# Patient Record
Sex: Male | Born: 1940 | Hispanic: Yes | Marital: Single | Smoking: Former smoker
Health system: Southern US, Community
[De-identification: ages and names within clinical notes are randomized; demographics above are authoritative.]

## PROBLEM LIST (undated history)

## (undated) DIAGNOSIS — I10 Essential (primary) hypertension: Secondary | ICD-10-CM

## (undated) DIAGNOSIS — D649 Anemia, unspecified: Secondary | ICD-10-CM

## (undated) DIAGNOSIS — M5137 Other intervertebral disc degeneration, lumbosacral region: Secondary | ICD-10-CM

## (undated) DIAGNOSIS — E785 Hyperlipidemia, unspecified: Secondary | ICD-10-CM

## (undated) DIAGNOSIS — F32A Depression, unspecified: Secondary | ICD-10-CM

## (undated) DIAGNOSIS — B029 Zoster without complications: Secondary | ICD-10-CM

## (undated) DIAGNOSIS — R0602 Shortness of breath: Secondary | ICD-10-CM

## (undated) DIAGNOSIS — K279 Peptic ulcer, site unspecified, unspecified as acute or chronic, without hemorrhage or perforation: Secondary | ICD-10-CM

## (undated) DIAGNOSIS — J45909 Unspecified asthma, uncomplicated: Secondary | ICD-10-CM

## (undated) DIAGNOSIS — F329 Major depressive disorder, single episode, unspecified: Secondary | ICD-10-CM

## (undated) DIAGNOSIS — K219 Gastro-esophageal reflux disease without esophagitis: Secondary | ICD-10-CM

## (undated) DIAGNOSIS — F1010 Alcohol abuse, uncomplicated: Secondary | ICD-10-CM

## (undated) DIAGNOSIS — Z8669 Personal history of other diseases of the nervous system and sense organs: Secondary | ICD-10-CM

## (undated) DIAGNOSIS — C3490 Malignant neoplasm of unspecified part of unspecified bronchus or lung: Secondary | ICD-10-CM

## (undated) DIAGNOSIS — M5030 Other cervical disc degeneration, unspecified cervical region: Secondary | ICD-10-CM

## (undated) DIAGNOSIS — I639 Cerebral infarction, unspecified: Secondary | ICD-10-CM

## (undated) DIAGNOSIS — M51379 Other intervertebral disc degeneration, lumbosacral region without mention of lumbar back pain or lower extremity pain: Secondary | ICD-10-CM

## (undated) HISTORY — DX: Other cervical disc degeneration, unspecified cervical region: M50.30

## (undated) HISTORY — DX: Unspecified asthma, uncomplicated: J45.909

## (undated) HISTORY — DX: Alcohol abuse, uncomplicated: F10.10

## (undated) HISTORY — DX: Anemia, unspecified: D64.9

## (undated) HISTORY — DX: Essential (primary) hypertension: I10

## (undated) HISTORY — DX: Major depressive disorder, single episode, unspecified: F32.9

## (undated) HISTORY — DX: Zoster without complications: B02.9

## (undated) HISTORY — DX: Shortness of breath: R06.02

## (undated) HISTORY — DX: Gastro-esophageal reflux disease without esophagitis: K21.9

## (undated) HISTORY — DX: Depression, unspecified: F32.A

## (undated) HISTORY — PX: LYMPH NODE BIOPSY: SHX201

## (undated) HISTORY — DX: Cerebral infarction, unspecified: I63.9

## (undated) HISTORY — DX: Other intervertebral disc degeneration, lumbosacral region without mention of lumbar back pain or lower extremity pain: M51.379

## (undated) HISTORY — DX: Hyperlipidemia, unspecified: E78.5

## (undated) HISTORY — PX: LUNG BIOPSY: SHX5088

## (undated) HISTORY — DX: Other intervertebral disc degeneration, lumbosacral region: M51.37

## (undated) HISTORY — DX: Malignant neoplasm of unspecified part of unspecified bronchus or lung: C34.90

## (undated) HISTORY — DX: Peptic ulcer, site unspecified, unspecified as acute or chronic, without hemorrhage or perforation: K27.9

## (undated) HISTORY — DX: Personal history of other diseases of the nervous system and sense organs: Z86.69

---

## 2014-02-14 ENCOUNTER — Other Ambulatory Visit (INDEPENDENT_AMBULATORY_CARE_PROVIDER_SITE_OTHER): Payer: Medicare Other

## 2014-02-14 ENCOUNTER — Ambulatory Visit (INDEPENDENT_AMBULATORY_CARE_PROVIDER_SITE_OTHER): Payer: Medicare Other | Admitting: Physician Assistant

## 2014-02-14 ENCOUNTER — Encounter: Payer: Self-pay | Admitting: Physician Assistant

## 2014-02-14 VITALS — BP 172/98 | HR 83 | Temp 98.5°F | Ht 71.0 in | Wt 147.8 lb

## 2014-02-14 DIAGNOSIS — I10 Essential (primary) hypertension: Secondary | ICD-10-CM

## 2014-02-14 DIAGNOSIS — D1779 Benign lipomatous neoplasm of other sites: Secondary | ICD-10-CM

## 2014-02-14 DIAGNOSIS — R351 Nocturia: Secondary | ICD-10-CM

## 2014-02-14 DIAGNOSIS — R9431 Abnormal electrocardiogram [ECG] [EKG]: Secondary | ICD-10-CM

## 2014-02-14 DIAGNOSIS — H538 Other visual disturbances: Secondary | ICD-10-CM

## 2014-02-14 DIAGNOSIS — Z23 Encounter for immunization: Secondary | ICD-10-CM

## 2014-02-14 DIAGNOSIS — Z1211 Encounter for screening for malignant neoplasm of colon: Secondary | ICD-10-CM

## 2014-02-14 DIAGNOSIS — D171 Benign lipomatous neoplasm of skin and subcutaneous tissue of trunk: Secondary | ICD-10-CM

## 2014-02-14 DIAGNOSIS — F4310 Post-traumatic stress disorder, unspecified: Secondary | ICD-10-CM

## 2014-02-14 DIAGNOSIS — Z0000 Encounter for general adult medical examination without abnormal findings: Secondary | ICD-10-CM

## 2014-02-14 LAB — LIPID PANEL
Cholesterol: 194 mg/dL (ref 0–200)
HDL: 67.1 mg/dL (ref >39.00)
LDL Cholesterol: 118 mg/dL — ABNORMAL HIGH (ref 0–99)
Total CHOL/HDL Ratio: 3
Triglycerides: 43 mg/dL (ref 0.0–149.0)
VLDL: 8.6 mg/dL (ref 0.0–40.0)

## 2014-02-14 LAB — URINALYSIS, ROUTINE W REFLEX MICROSCOPIC
Hgb urine dipstick: NEGATIVE
Ketones, ur: NEGATIVE
LEUKOCYTES UA: NEGATIVE
Nitrite: NEGATIVE
SPECIFIC GRAVITY, URINE: 1.025 (ref 1.000–1.030)
UROBILINOGEN UA: 1 (ref 0.0–1.0)
Urine Glucose: NEGATIVE
pH: 6 (ref 5.0–8.0)

## 2014-02-14 LAB — CBC WITH DIFFERENTIAL/PLATELET
Basophils Absolute: 0.1 10*3/uL (ref 0.0–0.1)
Basophils Relative: 0.7 % (ref 0.0–3.0)
EOS ABS: 0.1 10*3/uL (ref 0.0–0.7)
Eosinophils Relative: 1.1 % (ref 0.0–5.0)
HCT: 39.3 % (ref 39.0–52.0)
Hemoglobin: 12.4 g/dL — ABNORMAL LOW (ref 13.0–17.0)
Lymphocytes Relative: 24.4 % (ref 12.0–46.0)
Lymphs Abs: 1.8 10*3/uL (ref 0.7–4.0)
MCHC: 31.7 g/dL (ref 30.0–36.0)
MCV: 92.3 fl (ref 78.0–100.0)
Monocytes Absolute: 0.8 10*3/uL (ref 0.1–1.0)
Monocytes Relative: 10.8 % (ref 3.0–12.0)
NEUTROS PCT: 63 % (ref 43.0–77.0)
Neutro Abs: 4.8 10*3/uL (ref 1.4–7.7)
Platelets: 199 10*3/uL (ref 150.0–400.0)
RBC: 4.25 Mil/uL (ref 4.22–5.81)
RDW: 14 % (ref 11.5–14.6)
WBC: 7.6 10*3/uL (ref 4.5–10.5)

## 2014-02-14 LAB — BASIC METABOLIC PANEL
BUN: 19 mg/dL (ref 6–23)
CO2: 30 mEq/L (ref 19–32)
CREATININE: 0.9 mg/dL (ref 0.4–1.5)
Calcium: 9.4 mg/dL (ref 8.4–10.5)
Chloride: 106 mEq/L (ref 96–112)
GFR: 91.61 mL/min (ref >60.00)
GLUCOSE: 82 mg/dL (ref 70–99)
Potassium: 4.1 mEq/L (ref 3.5–5.1)
Sodium: 140 mEq/L (ref 135–145)

## 2014-02-14 LAB — HEPATIC FUNCTION PANEL
ALT: 10 U/L (ref 0–53)
AST: 15 U/L (ref 0–37)
Albumin: 3.7 g/dL (ref 3.5–5.2)
Alkaline Phosphatase: 77 U/L (ref 39–117)
BILIRUBIN DIRECT: 0.1 mg/dL (ref 0.0–0.3)
BILIRUBIN TOTAL: 1.1 mg/dL (ref 0.3–1.2)
Total Protein: 7.2 g/dL (ref 6.0–8.3)

## 2014-02-14 LAB — PSA: PSA: 0.72 ng/mL (ref 0.10–4.00)

## 2014-02-14 LAB — TSH: TSH: 0.93 u[IU]/mL (ref 0.35–5.50)

## 2014-02-14 LAB — CARDIAC PANEL
CK-MB: 1.2 ng/mL (ref 0.3–4.0)
Relative Index: 1.8 calc (ref 0.0–2.5)
Total CK: 68 U/L (ref 7–232)

## 2014-02-14 MED ORDER — HYDROCHLOROTHIAZIDE 12.5 MG PO CAPS: 12.5000 mg | ORAL_CAPSULE | Freq: Every day | ORAL | Status: DC

## 2014-02-14 MED ORDER — OLMESARTAN MEDOXOMIL 20 MG PO TABS: 20.0000 mg | ORAL_TABLET | Freq: Every day | ORAL | Status: DC

## 2014-02-14 NOTE — Patient Instructions (Addendum)
It was great meeting you today [name]!   Labs have been ordered for you, when you report to lab please be fasting.      Lipoma A lipoma is a noncancerous (benign) tumor composed of fat cells. They are usually found under the skin (subcutaneous). A lipoma may occur in any tissue of the body that contains fat. Common areas for lipomas to appear include the back, shoulders, buttocks, and thighs. Lipomas are a very common soft tissue growth. They are soft and grow slowly. Most problems caused by a lipoma depend on where it is growing. DIAGNOSIS  A lipoma can be diagnosed with a physical exam. These tumors rarely become cancerous, but radiographic studies can help determine this for certain. Studies used may include:  Computerized X-ray scans (CT or CAT scan).  Computerized magnetic scans (MRI). TREATMENT  Small lipomas that are not causing problems may be watched. If a lipoma continues to enlarge or causes problems, removal is often the best treatment. Lipomas can also be removed to improve appearance. Surgery is done to remove the fatty cells and the surrounding capsule. Most often, this is done with medicine that numbs the area (local anesthetic). The removed tissue is examined under a microscope to make sure it is not cancerous. Keep all follow-up appointments with your caregiver. SEEK MEDICAL CARE IF:   The lipoma becomes larger or hard.  The lipoma becomes painful, red, or increasingly swollen. These could be signs of infection or a more serious condition. Document Released: 11/22/2002 Document Revised: 02/24/2012 Document Reviewed: 05/04/2010 [facility] Patient Information 2014 [place], [place].  DASH Diet The DASH diet stands for "Dietary Approaches to Stop Hypertension." It is a healthy eating plan that has been shown to reduce high blood pressure (hypertension) in as little as 14 days, while also possibly providing other significant health benefits. These other health benefits  include reducing the risk of breast cancer after menopause and reducing the risk of type 2 diabetes, heart disease, colon cancer, and stroke. Health benefits also include weight loss and slowing kidney failure in patients with chronic kidney disease.  DIET GUIDELINES  Limit salt (sodium). Your diet should contain less than 1500 mg of sodium daily.  Limit refined or processed carbohydrates. Your diet should include mostly whole grains. Desserts and added sugars should be used sparingly.  Include small amounts of heart-healthy fats. These types of fats include nuts, oils, and tub margarine. Limit saturated and trans fats. These fats have been shown to be harmful in the body. CHOOSING FOODS  The following food groups are based on a 2000 calorie diet. See your Registered Dietitian for individual calorie needs. Grains and Grain Products (6 to 8 servings daily)  Eat More Often: Whole-wheat bread, brown rice, whole-grain or wheat pasta, quinoa, popcorn without added fat or salt (air popped).  Eat Less Often: White bread, white pasta, white rice, cornbread. Vegetables (4 to 5 servings daily)  Eat More Often: Fresh, frozen, and canned vegetables. Vegetables may be raw, steamed, roasted, or grilled with a minimal amount of fat.  Eat Less Often/Avoid: Creamed or fried vegetables. Vegetables in a cheese sauce. Fruit (4 to 5 servings daily)  Eat More Often: All fresh, canned (in natural juice), or frozen fruits. Dried fruits without added sugar. One hundred percent fruit juice ( cup [237 mL] daily).  Eat Less Often: Dried fruits with added sugar. Canned fruit in light or heavy syrup. [employer], Fish, and Poultry (2 servings or less daily. One serving is 3  to 4 oz [85-114 g]).  Eat More Often: Ninety percent or leaner ground beef, tenderloin, sirloin. Round cuts of beef, chicken breast, [place] breast. All fish. Grill, bake, or broil your meat. Nothing should be fried.  Eat Less Often/Avoid: Fatty  cuts of meat, [place], or chicken leg, thigh, or wing. Fried cuts of meat or fish. Dairy (2 to 3 servings)  Eat More Often: Low-fat or fat-free milk, low-fat plain or light yogurt, reduced-fat or part-skim cheese.  Eat Less Often/Avoid: Milk (whole, 2%).Whole milk yogurt. Full-fat cheeses. Nuts, Seeds, and Legumes (4 to 5 servings per week)  Eat More Often: All without added salt.  Eat Less Often/Avoid: Salted nuts and seeds, canned beans with added salt. Fats and Sweets (limited)  Eat More Often: Vegetable oils, tub margarines without trans fats, sugar-free gelatin. Mayonnaise and salad dressings.  Eat Less Often/Avoid: Coconut oils, palm oils, butter, stick margarine, cream, half and half, cookies, candy, pie. FOR MORE INFORMATION The Dash Diet Eating Plan: www.dashdiet.org Document Released: 11/21/2011 Document Revised: 02/24/2012 Document Reviewed: 11/21/2011 [facility] Patient Information 2014 [place], [place]. Hypertension Hypertension is another name for high blood pressure. High blood pressure may mean that your heart needs to work harder to pump blood. Blood pressure consists of two numbers, which includes a higher number over a lower number (example: 110/72). HOME CARE   Make lifestyle changes as told by your doctor. This may include weight loss and exercise.  Take your blood pressure medicine every day.  Limit how much salt you use.  Stop smoking if you smoke.  Do not use drugs.  Talk to your doctor if you are using decongestants or birth control pills. These medicines might make blood pressure higher.  Females should not drink more than 1 alcoholic drink per day. Males should not drink more than 2 alcoholic drinks per day.  See your doctor as told. GET HELP RIGHT AWAY IF:   You have a blood pressure reading with a top number of 180 or higher.  You get a very bad headache.  You get blurred or changing vision.  You feel confused.  You feel weak, numb, or  faint.  You get chest or belly (abdominal) pain.  You throw up (vomit).  You cannot breathe very well. MAKE SURE YOU:   Understand these instructions.  Will watch your condition.  Will get help right away if you are not doing well or get worse. Document Released: 05/20/2008 Document Revised: 02/24/2012 Document Reviewed: 05/20/2008 [facility] Patient Information 2014 [place], [place].   Health Maintenance, Males A healthy lifestyle and preventative care can promote health and wellness.  Maintain regular health, dental, and eye exams.  Eat a healthy diet. Foods like vegetables, fruits, whole grains, low-fat dairy products, and lean protein foods contain the nutrients you need and are low in calories. Decrease your intake of foods high in solid fats, added sugars, and salt. Get information about a proper diet from your health care provider, if necessary.  Regular physical exercise is one of the most important things you can do for your health. Most adults should get at least 150 minutes of moderate-intensity exercise (any activity that increases your heart rate and causes you to sweat) each week. In addition, most adults need muscle-strengthening exercises on 2 or more days a week.   Maintain a healthy weight. The body mass index (BMI) is a screening tool to identify possible weight problems. It provides an estimate of body fat based on height and weight. Your health care  provider can find your BMI and can help you achieve or maintain a healthy weight. For males 20 years and older:  A BMI below 18.5 is considered underweight.  A BMI of 18.5 to 24.9 is normal.  A BMI of 25 to 29.9 is considered overweight.  A BMI of 30 and above is considered obese.  Maintain normal blood lipids and cholesterol by exercising and minimizing your intake of saturated fat. Eat a balanced diet with plenty of fruits and vegetables. Blood tests for lipids and cholesterol should begin at age 68 and be  repeated every 5 years. If your lipid or cholesterol levels are high, you are over 50, or you are at high risk for heart disease, you may need your cholesterol levels checked more frequently.Ongoing high lipid and cholesterol levels should be treated with medicines, if diet and exercise are not working.  If you smoke, find out from your health care provider how to quit. If you do not use tobacco, do not start.  Lung cancer screening is recommended for adults aged 73 80 years who are at high risk for developing lung cancer because of a history of smoking. A yearly low-dose CT scan of the lungs is recommended for people who have at least a 30-pack-year history of smoking and are a current smoker or have quit within the past 15 years. A pack year of smoking is smoking an average of 1 pack of cigarettes a day for 1 year (for example, a 30-pack-year history of smoking could mean smoking 1 pack a day for 30 years or 2 packs a day for 15 years). Yearly screening should continue until the smoker has stopped smoking for at least 15 years. Yearly screening should be stopped for people who develop a health problem that would prevent them from having lung cancer treatment.  If you choose to drink alcohol, do not have more than 2 drinks per day. One drink is considered to be 12 oz (360 mL) of beer, 5 oz (150 mL) of wine, or 1.5 oz (45 mL) of liquor.  Avoid use of street drugs. Do not share needles with anyone. Ask for help if you need support or instructions about stopping the use of drugs.  High blood pressure causes heart disease and increases the risk of stroke. Blood pressure should be checked at least every 1 2 years. Ongoing high blood pressure should be treated with medicines if weight loss and exercise are not effective.  If you are 72 73 years old, ask your health care provider if you should take aspirin to prevent heart disease.  Diabetes screening involves taking a blood sample to check your fasting  blood sugar level. This should be done once every 3 years after age 39, if you are at a normal weight and without risk factors for diabetes. Testing should be considered at a younger age or be carried out more frequently if you are overweight and have at least 1 risk factor for diabetes.  Colorectal cancer can be detected and often prevented. Most routine colorectal cancer screening begins at the age of 39 and continues through age 5. However, your health care provider may recommend screening at an earlier age if you have risk factors for colon cancer. On a yearly basis, your health care provider may provide home test kits to check for hidden blood in the stool. A small camera at the end of a tube may be used to directly examine the colon (sigmoidoscopy or colonoscopy) to detect the  earliest forms of colorectal cancer. Talk to your health care provider about this at age 78, when routine screening begins. A direct exam of the colon should be repeated every 5 10 years through age 100, unless early forms of pre-cancerous polyps or small growths are found.  People who are at an increased risk for hepatitis B should be screened for this virus. You are considered at high risk for hepatitis B if:  You were born in a country where hepatitis B occurs often. Talk with your health care provider about which countries are considered high-risk.  Your parents were born in a high-risk country and you have not received a shot to protect against hepatitis B (hepatitis B vaccine).  You have HIV or AIDS.  You use needles to inject street drugs.  You live with, or have sex with, someone who has hepatitis B.  You are a man who has sex with other men (MSM).  You get hemodialysis treatment.  You take certain medicines for conditions like cancer, organ transplantation, and autoimmune conditions.  Hepatitis C blood testing is recommended for all people born from [birth year] through 1965 and any individual with known risk  factors for hepatitis C.  Healthy men should no longer receive prostate-specific antigen (PSA) blood tests as part of routine cancer screening. Talk to your health care provider about prostate cancer screening.  Testicular cancer screening is not recommended for adolescents or adult males who have no symptoms. Screening includes self-exam, a health care provider exam, and other screening tests. Consult with your health care provider about any symptoms you have or any concerns you have about testicular cancer.  Practice safe sex. Use condoms and avoid high-risk sexual practices to reduce the spread of sexually transmitted infections (STIs).  Use sunscreen. Apply sunscreen liberally and repeatedly throughout the day. You should seek shade when your shadow is shorter than you. Protect yourself by wearing long sleeves, pants, a wide-brimmed hat, and sunglasses year round, whenever you are outdoors.  Tell your health care provider of new moles or changes in moles, especially if there is a change in shape or color. Also tell your provider if a mole is larger than the size of a pencil eraser.  A one-time screening for abdominal aortic aneurysm (AAA) and surgical repair of large AAAs by ultrasound is recommended for men aged 29 75 years who are current or former smokers.  Stay current with your vaccines (immunizations). Document Released: 05/30/2008 Document Revised: 09/22/2013 Document Reviewed: 04/29/2011 [facility] Patient Information 2014 [place], [place].

## 2014-02-14 NOTE — Progress Notes (Signed)
Subjective:    Patient ID: Jared Rogers, male    DOB: 05-06-1941, 73 y.o.   MRN: [id number]  HPI Comments: Patient is a 73 year old male who presents to the office to establish care. Patient does not speak english and his brother is with him acting as interpreter. Last medical care is unknown.  Patient recently moved to the area from [place], resides with his brother and sister in [place]. Reports history of high blood pressure readings at home, numbers uncertain. Reports three days PTA experienced mild chest pain with palpitations, pains lasted nearly three hours. During this time states he was sweating with some SOB. Has not had anything since. Family members report he has a "tumor" on his left back shoulder that has been present for 10 years, is not painful or bothersome. Is up 3-4 times nightly to urinate. Complains of blurring vision. Family members report they think the patient suffers from PTSD, stating patient has terrible nightmares at night regarding his younger years growing up during war time. Patient dreams of people coming into house and raiding the house, attempting to poison people and the house being bombed.  Reports no other medical history or medication use. Denies, N/V/F/C, change in bowel habits, blood in urine or stool, difficulty initiating urination, pain with urination, inability to empty bladder. Denies lightheaded, dizziness, weakness, eye pain, HA,    Review of Systems  Constitutional: Positive for diaphoresis. Negative for fever and chills.  HENT: Negative for ear pain and trouble swallowing.   Eyes: Positive for visual disturbance (blurry). Negative for photophobia and pain.  Respiratory: Positive for shortness of breath. Negative for cough and wheezing.   Cardiovascular: Positive for chest pain (as stated in HPI) and palpitations. Negative for leg swelling.  Gastrointestinal: Negative for nausea, vomiting, abdominal pain and blood in stool.  Genitourinary: Negative for  dysuria, frequency, hematuria, difficulty urinating and testicular pain.  Musculoskeletal: Negative for back pain, gait problem and neck pain.  Skin: Negative for color change and rash.       Mass on left upper back  Neurological: Negative for dizziness, weakness, light-headedness, numbness and headaches.  Psychiatric/Behavioral: Positive for sleep disturbance.       Past Medical History  Diagnosis Date  . Depression   . Hypertension   . Hx of migraines   . Alcohol abuse   . GERD (gastroesophageal reflux disease)    Family History  Problem Relation Age of Onset  . Heart disease Father   . Lung cancer Sister    History   Social History  . Marital Status: Widowed    Spouse Name: N/A    Number of Children: N/A  . Years of Education: N/A   Social History Main Topics  . Smoking status: Current Every Day Smoker  . Smokeless tobacco: None  . Alcohol Use: No  . Drug Use: No  . Sexual Activity: None   Other Topics Concern  . None   Social History Narrative  . None   History reviewed. No pertinent past surgical history. No Known Allergies  No current outpatient prescriptions on file prior to visit.   No current facility-administered medications on file prior to visit.    Objective:   Physical Exam  Vitals reviewed. Constitutional: He is oriented to person, place, and time. He appears well-developed and well-nourished. No distress.  HENT:  Head: Normocephalic and atraumatic.  Right Ear: Hearing, tympanic membrane, external ear and ear canal normal.  Left Ear: Hearing, tympanic membrane, external ear  and ear canal normal.  Nose: Nose normal.  Mouth/Throat: Uvula is midline and oropharynx is clear and moist.  Poor dentition, few teeth remain  Eyes: Conjunctivae are normal. Pupils are equal, round, and reactive to light.  Clouding of bilateral cornea  Neck: Trachea normal and normal range of motion. No tracheal tenderness present. Carotid bruit is not present.    Cardiovascular: Normal rate, regular rhythm and intact distal pulses.  Exam reveals no gallop and no friction rub.   No murmur heard. Pulses:      Radial pulses are 2+ on the right side, and 2+ on the left side.       Dorsalis pedis pulses are 2+ on the right side, and 2+ on the left side.  Pulmonary/Chest: He has no wheezes. He has no rhonchi. He has no rales.  Abdominal: Soft. Normal appearance and bowel sounds are normal. He exhibits no distension. There is no hepatosplenomegaly. There is no tenderness. There is no CVA tenderness.  Musculoskeletal:  FROM U/LE bilateral  Lymphadenopathy:    He has no cervical adenopathy.       Right: No supraclavicular adenopathy present.       Left: No supraclavicular adenopathy present.  Neurological: He is alert and oriented to person, place, and time. He has normal strength. No cranial nerve deficit or sensory deficit. He displays a negative Romberg sign. Coordination and gait normal. GCS eye subscore is 4. GCS verbal subscore is 5. GCS motor subscore is 6.  Reflex Scores:      Patellar reflexes are 2+ on the right side and 2+ on the left side. Skin: Skin is warm and dry. He is not diaphoretic.  10-12 cm fluctuant, non tender mass noted to left upper back over scapula. Overlying soft tissue without erythema, tactile heat or drainage. Appears consistent with a lipoma.  Psychiatric: He has a normal mood and affect. His speech is normal and behavior is normal.      Filed Vitals:   02/14/14 0928  BP: 172/98  Pulse: 83  Temp: 98.5 F (36.9 C)   Patient able to perform ADL's, has had no falls from a standing position.  ECG: today Sinus rhythm, left axis fascicular block, non specific T abnormality, rate: 78 bpm.  Assessment & Plan:     CPX/v70.0 - Patient has been counseled on age-appropriate routine health concerns for screening and prevention. These are reviewed and up-to-date. Immunizations are up-to-date or declined. Labs ordered and ECG  reviewed.  HM: Refer for colonoscopy Updated Tetanus and pneumovax  HTN: ECG today-reading above Rx for HCTZ 12.5 mg once daily and Olmesartan 20 mg once daily RTO in 1 month for evaluation. Patient instructed to take bp readings at home and bring record to next appointment.  Blurry vision: Referral to Ophthalmology for evaluation  Lipoma: Referral to general surgeon for evaluation and treatment  Abnormal ECG Order for echo

## 2014-02-14 NOTE — Progress Notes (Signed)
Pre-visit discussion using our clinic review tool. No additional management support is needed unless otherwise documented below in the visit note.  

## 2014-02-15 ENCOUNTER — Telehealth: Payer: Self-pay | Admitting: *Deleted

## 2014-02-15 LAB — TROPONIN I: Troponin I: <0.01 ng/mL (ref <0.06)

## 2014-02-15 NOTE — Telephone Encounter (Signed)
Amy phoned & stated patient's bp was 201/96.  Stated she did not p/u benicar b/c of expense (a month's worth of samples) which she is going to p/u.  Also, is requesting possible alternate off of [employer]'s $4 formulary list (dropping off when she p/u samples).    Please advise.

## 2014-02-16 ENCOUNTER — Telehealth: Payer: Self-pay | Admitting: *Deleted

## 2014-02-16 NOTE — Telephone Encounter (Signed)
Miss and close lab encounter by mistake. Called pt spoke with his sister-in-law since pt can't speak english. Bonnita Levan response concerning his labs. See msg below.../lmb     Result Notes    Notes Recorded by Stacy Gardner, PA-C on 02/15/2014 at 11:20 PM Please phone patient and inform him all labs were normal. Keep f/u appointment as scheduled.            Results   Urinalysis, Routine w reflex microscopic (Order [id number])        Urinalysis, Routine w reflex microscopic  Status: Final result     Visible to patient: This result is not viewable by the patient.     Next appt: 03/02/2014 at 01:00 PM in No Specialty (MC-SITE 3 ECHO ECHO 3)     Dx: Unspecified essential hypertension; N...               Notes Recorded by Stacy Gardner, PA-C on 02/15/2014 at 11:20 PM Please phone patient and inform him all labs were normal. Keep f/u appointment as scheduled.      Ref Range 2d ago   Color, Urine Yellow;Lt. Yellow  YELLOW     APPearance Clear  CLEAR     Specific Gravity, Urine 1.000-1.030  1.025     pH 5.0 - 8.0  6.0     Total Protein, Urine Negative  TRACE (A)     Urine Glucose Negative  NEGATIVE     Ketones, ur Negative  NEGATIVE     Bilirubin Urine Negative  SMALL (A)     Hgb urine dipstick Negative  NEGATIVE     Urobilinogen, UA 0.0 - 1.0  1.0     Leukocytes, UA Negative  NEGATIVE     Nitrite Negative  NEGATIVE     WBC, UA 0-2/hpf  0-2/hpf     RBC / HPF 0-2/hpf  0-2/hpf     Mucus, UA None  Presence of (A)     Squamous Epithelial / LPF Rare(0-4/hpf)  Rare(0-4/hpf)     Hyaline Casts, UA None  Presence of (A)     Resulting Agency [facility]    Specimen Collected: 02/14/14 10:49 AM Last Resulted: 02/14/14 11:33 AM                    Other Results from 02/14/2014             CBC with Differential  Status: Final result     Visible to patient: This result is not viewable by the patient.     Next appt: 03/02/2014 at 01:00 PM in No Specialty (MC-SITE 3 ECHO ECHO 3)      Dx: Unspecified essential hypertension; N...               Notes Recorded by Stacy Gardner, PA-C on 02/15/2014 at 11:20 PM Please phone patient and inform him all labs were normal. Keep f/u appointment as scheduled.      Ref Range 2d ago   WBC 4.5 - 10.5 K/uL 7.6     RBC 4.22 - 5.81 Mil/uL 4.25     Hemoglobin 13.0 - 17.0 g/dL 12.4 (L)     HCT 39.0 - 52.0 % 39.3     MCV 78.0 - 100.0 fl 92.3     MCHC 30.0 - 36.0 g/dL 31.7     RDW 11.5 - 14.6 % 14.0     Platelets 150.0 - 400.0 K/uL 199.0     Neutrophils Relative % 43.0 - 77.0 %  63.0     Lymphocytes Relative 12.0 - 46.0 % 24.4     Monocytes Relative 3.0 - 12.0 % 10.8     Eosinophils Relative 0.0 - 5.0 % 1.1     Basophils Relative 0.0 - 3.0 % 0.7     Neutro Abs 1.4 - 7.7 K/uL 4.8     Lymphs Abs 0.7 - 4.0 K/uL 1.8     Monocytes Absolute 0.1 - 1.0 K/uL 0.8     Eosinophils Absolute 0.0 - 0.7 K/uL 0.1     Basophils Absolute 0.0 - 0.1 K/uL 0.1     Resulting Agency [facility]    Specimen Collected: 02/14/14 10:49 AM Last Resulted: 02/14/14 11:21 AM                 Lipid panel  Status: Final result     Visible to patient: This result is not viewable by the patient.     Next appt: 03/02/2014 at 01:00 PM in No Specialty (MC-SITE 3 ECHO ECHO 3)     Dx: Unspecified essential hypertension; N...               Notes Recorded by Stacy Gardner, PA-C on 02/15/2014 at 11:20 PM Please phone patient and inform him all labs were normal. Keep f/u appointment as scheduled.      Ref Range 2d ago   Cholesterol 0 - 200 mg/dL 194     Comments: ATP III Classification       Desirable:  < 200 mg/dL               Borderline High:  200 - 239 mg/dL          High:  > = 240 mg/dL  Triglycerides 0.0 - 149.0 mg/dL 43.0     Comments: Normal:  <150 mg/dLBorderline High:  150 - 199 mg/dL  HDL >39.00 mg/dL 67.10     VLDL 0.0 - 40.0 mg/dL 8.6     LDL Cholesterol 0 - 99 mg/dL 118 (H)     Total CHOL/HDL Ratio   3     Comments:                 Men          Women1/2 Average Risk     3.4          3.3Average Risk          5.0          4.42X Average Risk          9.6          7.13X Average Risk          15.0          11.0                      Resulting Agency [facility]    Specimen Collected: 02/14/14 10:49 AM Last Resulted: 02/14/14  3:09 PM                 TSH  Status: Final result     Visible to patient: This result is not viewable by the patient.     Next appt: 03/02/2014 at 01:00 PM in No Specialty (MC-SITE 3 ECHO ECHO 3)     Dx: Unspecified essential hypertension; N...            Notes Recorded by Stacy Gardner, PA-C on 02/15/2014 at 11:20 PM Please phone patient and inform him  all labs were normal. Keep f/u appointment as scheduled.      Ref Range 2d ago   TSH 0.35 - 5.50 uIU/mL 0.93     Resulting Agency [facility]    Specimen Collected: 02/14/14 10:49 AM Last Resulted: 02/14/14  3:09 PM                 Hepatic function panel  Status: Final result     Visible to patient: This result is not viewable by the patient.     Next appt: 03/02/2014 at 01:00 PM in No Specialty (MC-SITE 3 ECHO ECHO 3)     Dx: Unspecified essential hypertension; N...            Notes Recorded by Stacy Gardner, PA-C on 02/15/2014 at 11:20 PM Please phone patient and inform him all labs were normal. Keep f/u appointment as scheduled.      Ref Range 2d ago   Total Bilirubin 0.3 - 1.2 mg/dL 1.1     Bilirubin, Direct 0.0 - 0.3 mg/dL 0.1     Alkaline Phosphatase 39 - 117 U/L 77     AST 0 - 37 U/L 15     ALT 0 - 53 U/L 10     Total Protein 6.0 - 8.3 g/dL 7.2     Albumin 3.5 - 5.2 g/dL 3.7     Resulting Agency [facility]    Specimen Collected: 02/14/14 10:49 AM Last Resulted: 02/14/14  3:09 PM                 Basic metabolic panel  Status: Final result     Visible to patient: This result is not viewable by the patient.     Next appt: 03/02/2014 at 01:00 PM in No Specialty (MC-SITE 3 ECHO ECHO 3)     Dx: Unspecified essential  hypertension; N...            Notes Recorded by Stacy Gardner, PA-C on 02/15/2014 at 11:20 PM Please phone patient and inform him all labs were normal. Keep f/u appointment as scheduled.      Ref Range 2d ago   Sodium 135 - 145 mEq/L 140     Potassium 3.5 - 5.1 mEq/L 4.1     Chloride 96 - 112 mEq/L 106     CO2 19 - 32 mEq/L 30     Glucose, Bld 70 - 99 mg/dL 82     BUN 6 - 23 mg/dL 19     Creatinine, Ser 0.4 - 1.5 mg/dL 0.9     Calcium 8.4 - 10.5 mg/dL 9.4     GFR >60.00 mL/min 91.61     Resulting Agency [facility]    Specimen Collected: 02/14/14 10:49 AM Last Resulted: 02/14/14  3:09 PM                 PSA  Status: Final result     Visible to patient: This result is not viewable by the patient.     Next appt: 03/02/2014 at 01:00 PM in No Specialty (MC-SITE 3 ECHO ECHO 3)     Dx: Unspecified essential hypertension; N...            Notes Recorded by Stacy Gardner, PA-C on 02/15/2014 at 11:20 PM Please phone patient and inform him all labs were normal. Keep f/u appointment as scheduled.      Ref Range 2d ago   PSA 0.10 - 4.00 ng/mL 0.72     Resulting Agency  [facility]  Specimen Collected: 02/14/14 10:49 AM Last Resulted: 02/14/14  3:09 PM                 Troponin I  Status: Final result     Visible to patient: This result is not viewable by the patient.     Next appt: 03/02/2014 at 01:00 PM in No Specialty (MC-SITE 3 ECHO ECHO 3)     Dx: Unspecified essential hypertension; N...            Notes Recorded by Stacy Gardner, PA-C on 02/15/2014 at 11:20 PM Please phone patient and inform him all labs were normal. Keep f/u appointment as scheduled.      Ref Range 2d ago   Troponin I <0.06 ng/mL <0.01     Comments: No indication of myocardial injury.  Resulting Agency [facility]        Result Narrative        Performed at:  [address], [address]                [place], [place] [address]         Specimen Collected:  02/14/14 10:49 AM Last Resulted: 02/15/14 12:11 PM                 Cardiac panel  Status: Final result     Visible to patient: This result is not viewable by the patient.     Next appt: 03/02/2014 at 01:00 PM in No Specialty (MC-SITE 3 ECHO ECHO 3)     Dx: Unspecified essential hypertension; N...            Notes Recorded by Stacy Gardner, PA-C on 02/15/2014 at 11:20 PM Please phone patient and inform him all labs were normal. Keep f/u appointment as scheduled.      Ref Range 2d ago   Total CK 7 - 232 U/L 68     CK-MB 0.3 - 4.0 ng/mL 1.2     Relative Index 0.0 - 2.5 calc 1.8     Comments: Relative Index is invalid when CK is <100 U/L  Resulting Agency [facility]    Specimen Collected: 02/14/14 10:49 AM Last Resulted: 02/14/14  3:09 PM                             Reviewed by List    Earnstine Regal, MA on 02/16/2014 11:47 AM    Stacy Gardner, PA-C on 02/15/2014 11:20 PM        Encounter    View Encounter       Result Information         Abnormal    Status: Final result (02/14/2014 11:33 AM)    Provider Status: Reviewed       Lab Information    [facility]                    Order-Level Documents:    There are no order-level documents.           Urinalysis, Routine w reflex microscopic (Order [id number])  Lab  Order: [id number]   Released By: Kelle Darting  Authorizing: Stacy Gardner, PA-C   Date: 02/14/2014  Department: [facility]           Order Information      Order Date/Time  02/14/14 10:19 AM      Release Date/Time: 02/14/14 10:49 AM      Start Date/Time: 02/14/14 10:49 AM      End Date/Time: None                Order Details      Frequency      None      Duration: None      Priority: Routine      Order Class: [facility]              Acc#      [id number]               Associated Diagnoses      Unspecified essential hypertension [401.9]         Need for prophylactic vaccination with tetanus-diphtheria  (TD) [V06.5]         Need for prophylactic vaccination against Streptococcus pneumoniae (pneumococcus) [V03.82]         Abnormal ECG [794.31]         Blurred vision, bilateral [368.8]         Nocturia [788.43]         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]         Screening for colon cancer [V76.51]                 Order History      Outpatient      Date/Time      Action Taken      User      Additional Information      02/14/14 1049      Release      Kelle Darting      From Order: [id number]      02/14/14 1133      Result      Lab In Three Zero One Interface      Final              Order Requisition      URINALYSIS, ROUTINE W REFLEX MICROSCOPIC (Order #[id number]) on 02/14/14             Collection Information      Collected: 02/14/2014 10:49 AM        Resulting Agency: [facility]                 Additional Information      Associated Reports      View Encounter      Priority and Order Details      Collection Information.              View SmartLink Info      URINALYSIS, ROUTINE W REFLEX MICROSCOPIC (Order #[id number]) on 02/14/14

## 2014-02-23 ENCOUNTER — Encounter: Payer: Self-pay | Admitting: Internal Medicine

## 2014-03-02 ENCOUNTER — Ambulatory Visit (HOSPITAL_COMMUNITY): Payer: Medicare Other | Attending: Cardiovascular Disease | Admitting: Cardiology

## 2014-03-02 ENCOUNTER — Encounter: Payer: Self-pay | Admitting: Cardiovascular Disease

## 2014-03-02 DIAGNOSIS — R351 Nocturia: Secondary | ICD-10-CM

## 2014-03-02 DIAGNOSIS — F17200 Nicotine dependence, unspecified, uncomplicated: Secondary | ICD-10-CM | POA: Insufficient documentation

## 2014-03-02 DIAGNOSIS — Z1211 Encounter for screening for malignant neoplasm of colon: Secondary | ICD-10-CM

## 2014-03-02 DIAGNOSIS — H538 Other visual disturbances: Secondary | ICD-10-CM

## 2014-03-02 DIAGNOSIS — I10 Essential (primary) hypertension: Secondary | ICD-10-CM | POA: Insufficient documentation

## 2014-03-02 DIAGNOSIS — D171 Benign lipomatous neoplasm of skin and subcutaneous tissue of trunk: Secondary | ICD-10-CM

## 2014-03-02 DIAGNOSIS — R9431 Abnormal electrocardiogram [ECG] [EKG]: Secondary | ICD-10-CM | POA: Insufficient documentation

## 2014-03-02 DIAGNOSIS — Z23 Encounter for immunization: Secondary | ICD-10-CM

## 2014-03-02 NOTE — Progress Notes (Signed)
Echo performed. 

## 2014-03-04 ENCOUNTER — Other Ambulatory Visit: Payer: Self-pay | Admitting: Physician Assistant

## 2014-03-04 DIAGNOSIS — R9431 Abnormal electrocardiogram [ECG] [EKG]: Secondary | ICD-10-CM

## 2014-03-29 ENCOUNTER — Ambulatory Visit (AMBULATORY_SURGERY_CENTER): Payer: Self-pay

## 2014-03-29 VITALS — Ht 71.0 in | Wt 149.0 lb

## 2014-03-29 DIAGNOSIS — Z1211 Encounter for screening for malignant neoplasm of colon: Secondary | ICD-10-CM

## 2014-03-29 MED ORDER — MOVIPREP 100 G PO SOLR: ORAL | Status: DC

## 2014-03-30 ENCOUNTER — Ambulatory Visit (INDEPENDENT_AMBULATORY_CARE_PROVIDER_SITE_OTHER): Payer: Medicare Other | Admitting: Cardiology

## 2014-03-30 ENCOUNTER — Encounter: Payer: Self-pay | Admitting: Cardiology

## 2014-03-30 VITALS — BP 156/92 | HR 77 | Ht 71.0 in | Wt 150.0 lb

## 2014-03-30 DIAGNOSIS — R0602 Shortness of breath: Secondary | ICD-10-CM | POA: Insufficient documentation

## 2014-03-30 DIAGNOSIS — I447 Left bundle-branch block, unspecified: Secondary | ICD-10-CM | POA: Insufficient documentation

## 2014-03-30 DIAGNOSIS — K279 Peptic ulcer, site unspecified, unspecified as acute or chronic, without hemorrhage or perforation: Secondary | ICD-10-CM | POA: Insufficient documentation

## 2014-03-30 DIAGNOSIS — I10 Essential (primary) hypertension: Secondary | ICD-10-CM | POA: Insufficient documentation

## 2014-03-30 MED ORDER — OLMESARTAN MEDOXOMIL 40 MG PO TABS: 40.0000 mg | ORAL_TABLET | Freq: Every day | ORAL | Status: DC

## 2014-03-30 NOTE — Progress Notes (Signed)
[address], [place], [place]  [address] Phone: [phone] Fax:  [phone]  Date:  03/30/2014   ID:  Jared Rogers, DOB March 12, 1941, MRN [id number]  PCP:  Stacy Gardner, PA-C  Cardiologist:  Fransico Him, MD     History of Present Illness: Jared Rogers is a 73 y.o. male who has been referred for evaluation of SOB and HTN.  He says that the SOB has been going on for a few months.  The SOB occurs mainly in the daytime when he is moving around.  He also gets SOB in the middle of the night.  He denies any chest pain or pressure.  He denies any palpitations, dizziness or syncope.  He has had some LE edema.  He no longer uses any added sodium in his diet. He does not speak [place] and all history is obtained through an interpreter who is his brother in Sports [employer].  He recently moved form [place] and lives with his brother in [employer] [place].  He has been having high BP readings at home.  It is stated in his PCP note that he was having CP but he denies this.  He was started on Olmesartan and HCTZ by his PCP.  He also has a lipoma on his back which he is seeing a Psychologist, sport and exercise for.     Wt Readings from Last 3 Encounters:  03/30/14 150 lb (68.04 kg)  03/29/14 149 lb (67.586 kg)  02/14/14 147 lb 12.8 oz (67.042 kg)     Past Medical History  Diagnosis Date  . Depression   . Hx of migraines   . Alcohol abuse     In past  . GERD (gastroesophageal reflux disease)   . SOB (shortness of breath)     sometimes at night  . Hypertension   . PUD (peptic ulcer disease)     with bleeding ulcers and bowel obstruction in 1994 and 1995  . DDD (degenerative disc disease), cervical   . DDD (degenerative disc disease), lumbosacral   . Hyperlipidemia   . Stroke     cerebellar CVA  . Shingles   . Asthmatic bronchitis     Current Outpatient Prescriptions  Medication Sig Dispense Refill  . haloperidol (HALDOL) 1 MG tablet Take 1 mg by mouth at bedtime.      . hydrochlorothiazide (MICROZIDE) 12.5 MG  capsule Take 1 capsule (12.5 mg total) by mouth daily.  30 capsule  3  . MOVIPREP 100 G SOLR Moviprep as directed / no substitutions  1 kit  0  . Multiple Vitamin (MULTIVITAMIN) tablet Take 1 tablet by mouth daily.      Marland Kitchen olmesartan (BENICAR) 20 MG tablet Take 1 tablet (20 mg total) by mouth daily.  30 tablet  3   No current facility-administered medications for this visit.    Allergies:    Allergies  Allergen Reactions  . Codeine     Social History:  The patient  reports that he has been smoking Cigarettes.  He has been smoking about 0.00 packs per day. He has never used smokeless tobacco. He reports that he does not drink alcohol or use illicit drugs.   Family History:  The patient's family history includes Colon cancer in his mother; Heart disease in his father; Heart failure in his father; Lung cancer in his sister.   ROS:  Please see the history of present illness.      All other systems reviewed and negative.   PHYSICAL EXAM: VS:  BP 156/92  Pulse 77  Ht 5' 11"  (1.803 m)  Wt 150 lb (68.04 kg)  BMI 20.93 kg/m2 Well nourished, well developed, in no acute distress HEENT: normal Neck: no JVD Cardiac:  normal S1, S2; RRR; no murmur Lungs:  clear to auscultation bilaterally, no wheezing, rhonchi or rales Abd: soft, nontender, no hepatomegaly Ext: no edema Skin: warm and dry Neuro:  CNs 2-12 intact, no focal abnormalities noted  EKG:  NSR with LBBB     ASSESSMENT AND PLAN:  1. SOB of unclear etiology.  Need to consider coronary ischemia and LV dysfunction as well as poorly controlled HTN as possible etiologies.  He has a LBBB on EKG - 2D echo to assess LVF - Stress Myoview to rule out ischemia 2. LBBB on EKG 3. HTN with borderline control - increase Olmesartan to 47m daily - recheck BP and BMET in 1 week with nurse  Followup with me in 4 weeks  Signed, TFransico Him MD 03/30/2014 2:17 PM

## 2014-03-30 NOTE — Patient Instructions (Signed)
Your physician has recommended you make the following change in your medication: 1. Increase Benicar to 40 MG daily  Your physician has requested that you have an echocardiogram. Echocardiography is a painless test that uses sound waves to create images of your heart. It provides your doctor with information about the size and shape of your heart and how well your heart's chambers and valves are working. This procedure takes approximately one hour. There are no restrictions for this procedure.  Your physician has requested that you have en exercise stress myoview. For further information please visit [email or web address]. Please follow instruction sheet, as given.  Your physician recommends that you return for lab work in: one week for a BMET schedule same day as nurse visit  Your physician recommends that you schedule a follow-up appointment in: 1 week for BP check and BMET  Your physician recommends that you schedule a follow-up appointment in: 4 weeks with Dr Radford Pax

## 2014-04-05 ENCOUNTER — Telehealth: Payer: Self-pay | Admitting: *Deleted

## 2014-04-05 NOTE — Telephone Encounter (Signed)
No ARBs are on the $4 list.  Losartan would be the cheapest though.

## 2014-04-05 NOTE — Telephone Encounter (Signed)
The patients caretaker, Amy, called stating that the patient needs a substitute for the benicar that is more affordable. It is $200.00/month and the patient is unable to afford that. They are trying to get him on medicaid. She prefers something on the $4.00 [facility] list if possible. Amy can be reached at [phone]. Thanks, MI

## 2014-04-05 NOTE — Telephone Encounter (Signed)
To Dr. Radford Pax.

## 2014-04-05 NOTE — Telephone Encounter (Signed)
Is Losartan on the $4 list?

## 2014-04-06 ENCOUNTER — Telehealth: Payer: Self-pay | Admitting: Cardiology

## 2014-04-06 ENCOUNTER — Ambulatory Visit (INDEPENDENT_AMBULATORY_CARE_PROVIDER_SITE_OTHER): Payer: Medicare Other | Admitting: Cardiology

## 2014-04-06 ENCOUNTER — Other Ambulatory Visit: Payer: Medicare Other

## 2014-04-06 VITALS — BP 180/82 | Ht 71.0 in

## 2014-04-06 DIAGNOSIS — I10 Essential (primary) hypertension: Secondary | ICD-10-CM

## 2014-04-06 LAB — BASIC METABOLIC PANEL
BUN: 15 mg/dL (ref 6–23)
CHLORIDE: 103 meq/L (ref 96–112)
CO2: 30 meq/L (ref 19–32)
CREATININE: 0.8 mg/dL (ref 0.4–1.5)
Calcium: 9.6 mg/dL (ref 8.4–10.5)
GFR: 105.44 mL/min (ref >60.00)
Glucose, Bld: 95 mg/dL (ref 70–99)
Potassium: 4 mEq/L (ref 3.5–5.1)
Sodium: 139 mEq/L (ref 135–145)

## 2014-04-06 MED ORDER — METOPROLOL SUCCINATE ER 25 MG PO TB24: 25.0000 mg | ORAL_TABLET | Freq: Every day | ORAL | Status: DC

## 2014-04-06 NOTE — Telephone Encounter (Signed)
New message     Can't afford toprol.  Is there something else he can take?  Want something off the 4.00 list at [facility].  Please call.

## 2014-04-06 NOTE — Telephone Encounter (Signed)
TO Ysidro Evert to advise.

## 2014-04-06 NOTE — Progress Notes (Signed)
Pt was seen for BP check today.  Per Dr. Radford Pax pt should stop Benicar and start Toprol XL 25 mg qd. Pt notified and rx sent into [facility].  Follow-up Nurse Visit on Monday 04/11/14.    Agree with above plan.  Patient cannot afford Benicar or any ARB.  Will proceed with Toprol.

## 2014-04-06 NOTE — Telephone Encounter (Signed)
See nurse note from today 4/22

## 2014-04-06 NOTE — Progress Notes (Signed)
Patient ID: Jared Rogers, male   DOB: 11/10/1941, 73 y.o.   MRN: [id number] Agree with plan above

## 2014-04-06 NOTE — Telephone Encounter (Signed)
Patient was to start on Toprol XL 25 mg qd.  Have her instead start metoprolol 12.5 mg bid.  Okay to take 1/2 tablet of metoprolol tartrate 25 mg pill, and take this twice daily.  This in on $4 list.

## 2014-04-07 ENCOUNTER — Other Ambulatory Visit: Payer: Self-pay | Admitting: General Surgery

## 2014-04-07 MED ORDER — METOPROLOL TARTRATE 25 MG PO TABS: 12.5000 mg | ORAL_TABLET | Freq: Two times a day (BID) | ORAL | Status: DC

## 2014-04-07 NOTE — Telephone Encounter (Signed)
rx sent in for pt and pt is aware

## 2014-04-07 NOTE — Telephone Encounter (Signed)
Pt is aware of change and med list updated and new rx sent in for pt.

## 2014-04-12 ENCOUNTER — Encounter: Payer: Self-pay | Admitting: Internal Medicine

## 2014-04-12 ENCOUNTER — Ambulatory Visit (AMBULATORY_SURGERY_CENTER): Payer: Medicare Other | Admitting: Internal Medicine

## 2014-04-12 VITALS — BP 171/89 | HR 63 | Temp 96.0°F | Resp 17 | Ht 71.0 in | Wt 150.0 lb

## 2014-04-12 DIAGNOSIS — Z1211 Encounter for screening for malignant neoplasm of colon: Secondary | ICD-10-CM

## 2014-04-12 MED ORDER — SODIUM CHLORIDE 0.9 % IV SOLN: 500.0000 mL | INTRAVENOUS | Status: DC

## 2014-04-12 NOTE — Patient Instructions (Signed)
YOU HAD AN ENDOSCOPIC PROCEDURE TODAY AT THE [facility]: Refer to the procedure report that was given to you for any specific questions about what was found during the examination.  If the procedure report does not answer your questions, please call your gastroenterologist to clarify.  If you requested that your care partner not be given the details of your procedure findings, then the procedure report has been included in a sealed envelope for you to review at your convenience later.  YOU SHOULD EXPECT: Some feelings of bloating in the abdomen. Passage of more gas than usual.  Walking can help get rid of the air that was put into your GI tract during the procedure and reduce the bloating. If you had a lower endoscopy (such as a colonoscopy or flexible sigmoidoscopy) you may notice spotting of blood in your stool or on the toilet paper. If you underwent a bowel prep for your procedure, then you may not have a normal bowel movement for a few days.  DIET: Your first meal following the procedure should be a light meal and then it is ok to progress to your normal diet.  A half-sandwich or bowl of soup is an example of a good first meal.  Heavy or fried foods are harder to digest and may make you feel nauseous or bloated.  Likewise meals heavy in dairy and vegetables can cause extra gas to form and this can also increase the bloating.  Drink plenty of fluids but you should avoid alcoholic beverages for 24 hours.  ACTIVITY: Your care partner should take you home directly after the procedure.  You should plan to take it easy, moving slowly for the rest of the day.  You can resume normal activity the day after the procedure however you should NOT DRIVE or use heavy machinery for 24 hours (because of the sedation medicines used during the test).    SYMPTOMS TO REPORT IMMEDIATELY: A gastroenterologist can be reached at any hour.  During normal business hours, 8:30 AM to 5:00 PM Monday through Friday,  call [phone].  After hours and on weekends, please call the GI answering service at [phone] who will take a message and have the physician on call contact you.   Following lower endoscopy (colonoscopy or flexible sigmoidoscopy):  Excessive amounts of blood in the stool  Significant tenderness or worsening of abdominal pains  Swelling of the abdomen that is new, acute  Fever of 100F or higher    FOLLOW UP: If any biopsies were taken you will be contacted by phone or by letter within the next 1-3 weeks.  Call your gastroenterologist if you have not heard about the biopsies in 3 weeks.  Our staff will call the home number listed on your records the next business day following your procedure to check on you and address any questions or concerns that you may have at that time regarding the information given to you following your procedure. This is a courtesy call and so if there is no answer at the home number and we have not heard from you through the emergency physician on call, we will assume that you have returned to your regular daily activities without incident.  SIGNATURES/CONFIDENTIALITY: You and/or your care partner have signed paperwork which will be entered into your electronic medical record.  These signatures attest to the fact that that the information above on your After Visit Summary has been reviewed and is understood.  Full responsibility of the confidentiality   of this discharge information lies with you and/or your care-partner.     

## 2014-04-12 NOTE — Op Note (Signed)
[facility]  [address] [place], [address]   COLONOSCOPY PROCEDURE REPORT  PATIENT: Jared, Rogers  MR#: [id number] BIRTHDATE: [birth year] , 72  yrs. old GENDER: Male ENDOSCOPIST: Eustace Quail, MD REFERRED BY: Stacy Gardner, PA. PROCEDURE DATE:  04/12/2014 PROCEDURE:   Colonoscopy, screening First Screening Colonoscopy - Avg.  risk and is 50 yrs.  old or older Yes.  Prior Negative Screening - Now for repeat screening. N/A  History of Adenoma - Now for follow-up colonoscopy & has been > or = to 3 yrs.  N/A  Polyps Removed Today? No.  Recommend repeat exam, <10 yrs? No. ASA CLASS:   Class II INDICATIONS:average risk screening. MEDICATIONS: MAC sedation, administered by CRNA and propofol (Diprivan) 150mg  IV  DESCRIPTION OF PROCEDURE:   After the risks benefits and alternatives of the procedure were thoroughly explained, informed consent was obtained.  A digital rectal exam revealed no abnormalities of the rectum.   The LB [id number]  endoscope was introduced through the anus and advanced to the cecum, which was identified by both the appendix and ileocecal valve. No adverse events experienced.   The quality of the prep was good, using MoviPrep  The instrument was then slowly withdrawn as the colon was fully examined.      COLON FINDINGS: A normal appearing cecum, ileocecal valve, and appendiceal orifice were identified.  The ascending, hepatic flexure, transverse, splenic flexure, descending, sigmoid colon and rectum appeared unremarkable.  No polyps or cancers were seen. Retroflexed views revealed no abnormalities. The time to cecum=4 minutes 55 seconds.  Withdrawal time=9 minutes 55 seconds.  The scope was withdrawn and the procedure completed.  COMPLICATIONS: There were no complications.  ENDOSCOPIC IMPRESSION: 1. Normal colon  RECOMMENDATIONS: 1. Return to the care of your primary provider.  GI follow up as needed   eSigned:  Eustace Quail, MD 04/12/2014 11:14 AM   cc: The Patient and Stacy Gardner, [place]

## 2014-04-12 NOTE — Progress Notes (Signed)
Lidocaine-40mg IV prior to Propofol InductionPropofol given over incremental dosages 

## 2014-04-13 ENCOUNTER — Telehealth: Payer: Self-pay | Admitting: *Deleted

## 2014-04-13 NOTE — Telephone Encounter (Signed)
  Follow up Call-  Call back number 04/12/2014  Post procedure Call Back phone  # Jared Rogers [phone]  Permission to leave phone message Yes     Patient questions:  Do you have a fever, pain , or abdominal swelling? no Pain Score  0 *  Have you tolerated food without any problems? yes  Have you been able to return to your normal activities? yes  Do you have any questions about your discharge instructions: Diet   no Medications  no Follow up visit  no  Do you have questions or concerns about your Care? no  Actions: * If pain score is 4 or above: No action needed, pain <4.

## 2014-04-18 ENCOUNTER — Encounter: Payer: Self-pay | Admitting: Internal Medicine

## 2014-04-18 ENCOUNTER — Ambulatory Visit (INDEPENDENT_AMBULATORY_CARE_PROVIDER_SITE_OTHER): Payer: Medicare Other | Admitting: Internal Medicine

## 2014-04-18 VITALS — BP 170/100 | HR 71 | Temp 98.1°F | Resp 18 | Wt 153.0 lb

## 2014-04-18 DIAGNOSIS — I10 Essential (primary) hypertension: Secondary | ICD-10-CM

## 2014-04-18 MED ORDER — TRIAMTERENE-HCTZ 37.5-25 MG PO TABS: 1.0000 | ORAL_TABLET | Freq: Every day | ORAL | Status: DC

## 2014-04-18 NOTE — Progress Notes (Signed)
Pre-visit discussion using our clinic review tool. No additional management support is needed unless otherwise documented below in the visit note.  

## 2014-04-18 NOTE — Progress Notes (Signed)
   Subjective:    Patient ID: Jared Rogers, male    DOB: 09-19-1941, 73 y.o.   MRN: [id number]  HPI Comments: New to me, he speaks very little english and there is no family or translator with him today but he indicates that he is here for a BP check. He does not have any symptoms today.     Review of Systems  Constitutional: Negative.  Negative for fever, chills, diaphoresis, appetite change and fatigue.  HENT: Negative.   Eyes: Negative.   Respiratory: Negative.  Negative for cough, choking, chest tightness, shortness of breath, wheezing and stridor.   Cardiovascular: Negative.  Negative for chest pain, palpitations and leg swelling.  Gastrointestinal: Negative.  Negative for nausea, vomiting, abdominal pain, diarrhea, constipation and blood in stool.  Endocrine: Negative.   Genitourinary: Negative.   Musculoskeletal: Negative.   Skin: Negative.   Allergic/Immunologic: Negative.   Neurological: Negative.  Negative for dizziness, tremors, syncope, weakness, light-headedness, numbness and headaches.  Hematological: Negative.  Negative for adenopathy. Does not bruise/bleed easily.  Psychiatric/Behavioral: Negative.        Objective:   Physical Exam  Vitals reviewed. Constitutional: He is oriented to person, place, and time. He appears well-developed and well-nourished. No distress.  HENT:  Head: Normocephalic and atraumatic.  Mouth/Throat: Oropharynx is clear and moist. No oropharyngeal exudate.  Eyes: Conjunctivae are normal. Right eye exhibits no discharge. Left eye exhibits no discharge. No scleral icterus.  Neck: Normal range of motion. Neck supple. No JVD present. No tracheal deviation present. No thyromegaly present.  Cardiovascular: Normal rate, regular rhythm, normal heart sounds and intact distal pulses.  Exam reveals no gallop and no friction rub.   No murmur heard. Pulmonary/Chest: Effort normal and breath sounds normal. No stridor. No respiratory distress. He  has no wheezes. He has no rales. He exhibits no tenderness.  Abdominal: Soft. Bowel sounds are normal. He exhibits no distension and no mass. There is no tenderness. There is no rebound and no guarding.  Musculoskeletal: Normal range of motion. He exhibits no edema and no tenderness.  Lymphadenopathy:    He has no cervical adenopathy.  Neurological: He is oriented to person, place, and time.  Skin: Skin is warm and dry. No rash noted. He is not diaphoretic. No erythema. No pallor.  Psychiatric: He has a normal mood and affect. His behavior is normal. Judgment and thought content normal.    Lab Results  Component Value Date   WBC 7.6 02/14/2014   HGB 12.4* 02/14/2014   HCT 39.3 02/14/2014   PLT 199.0 02/14/2014   GLUCOSE 95 04/06/2014   CHOL 194 02/14/2014   TRIG 43.0 02/14/2014   HDL 67.10 02/14/2014   LDLCALC 118* 02/14/2014   ALT 10 02/14/2014   AST 15 02/14/2014   NA 139 04/06/2014   K 4.0 04/06/2014   CL 103 04/06/2014   CREATININE 0.8 04/06/2014   BUN 15 04/06/2014   CO2 30 04/06/2014   TSH 0.93 02/14/2014   PSA 0.72 02/14/2014        Assessment & Plan:

## 2014-04-18 NOTE — Patient Instructions (Signed)

## 2014-04-19 ENCOUNTER — Telehealth: Payer: Self-pay | Admitting: Internal Medicine

## 2014-04-19 ENCOUNTER — Ambulatory Visit (HOSPITAL_BASED_OUTPATIENT_CLINIC_OR_DEPARTMENT_OTHER): Payer: Medicare Other | Admitting: Radiology

## 2014-04-19 ENCOUNTER — Ambulatory Visit (HOSPITAL_COMMUNITY): Payer: Medicare Other | Attending: Cardiology | Admitting: Cardiology

## 2014-04-19 ENCOUNTER — Encounter: Payer: Self-pay | Admitting: Internal Medicine

## 2014-04-19 VITALS — BP 188/102 | HR 66 | Ht 71.0 in | Wt 146.0 lb

## 2014-04-19 DIAGNOSIS — I447 Left bundle-branch block, unspecified: Secondary | ICD-10-CM

## 2014-04-19 DIAGNOSIS — R0602 Shortness of breath: Secondary | ICD-10-CM

## 2014-04-19 MED ORDER — TECHNETIUM TC 99M SESTAMIBI GENERIC - CARDIOLITE
33.0000 | Freq: Once | INTRAVENOUS | Status: AC | PRN
  Administered 2014-04-19: 33 via INTRAVENOUS

## 2014-04-19 MED ORDER — ADENOSINE (DIAGNOSTIC) 3 MG/ML IV SOLN
0.5600 mg/kg | Freq: Once | INTRAVENOUS | Status: AC
  Administered 2014-04-19: 37.2 mg via INTRAVENOUS

## 2014-04-19 MED ORDER — TECHNETIUM TC 99M SESTAMIBI GENERIC - CARDIOLITE
11.0000 | Freq: Once | INTRAVENOUS | Status: AC | PRN
  Administered 2014-04-19: 11 via INTRAVENOUS

## 2014-04-19 NOTE — Telephone Encounter (Signed)
Relevant patient education mailed to patient.  

## 2014-04-19 NOTE — Progress Notes (Signed)
[place], [place] [address] [phone]    Cardiology Nuclear Med Study  Jared Rogers is a 73 y.o. male     MRN : [id number]     DOB: [birth year]  Procedure Date: 04/19/2014  Nuclear Med Background Indication for Stress Test:  Evaluation for Ischemia History:  No known CAD, Echo 2015 EF 55-60% Cardiac Risk Factors: CVA, Family History - CAD, Hypertension, LBBB and Smoker  Symptoms:  SOB   Nuclear Pre-Procedure Caffeine/Decaff Intake:  None > 12 hrs NPO After: 10:00pm   Lungs:  clear O2 Sat: 98% on room air. IV 0.9% NS with Angio Cath:  22g  IV Site: R Forearm x 1, tolerated well IV Started by:  Irven Baltimore, RN  Chest Size (in):  40 Cup Size: n/a  Height: 5\' 11"  (1.803 m)  Weight:  146 lb (66.225 kg)  BMI:  Body mass index is 20.37 kg/(m^2). Tech Comments:  Patient took Lopressor this am. Irven Baltimore, RN    Nuclear Med Study 1 or 2 day study: 1 day  Stress Test Type:  Adenosine  Reading MD: N/A  Order Authorizing Provider:  Fransico Him, MD  Resting Radionuclide: Technetium 8m Sestamibi  Resting Radionuclide Dose: 11.9 mCi   Stress Radionuclide:  Technetium 47m Sestamibi  Stress Radionuclide Dose: 33.0 mCi           Stress Protocol Rest HR: 66 Stress HR: 78  Rest BP: 188/102 Stress BP: 145/87  Exercise Time (min): n/a METS: n/a           Dose of Adenosine (mg):  37.2 mg Dose of Lexiscan: n/a mg  Dose of Atropine (mg): n/a Dose of Dobutamine: n/a mcg/kg/min (at max HR)  Stress Test Technologist: Glade Lloyd, BS-ES  Nuclear Technologist:  Charlton Amor, CNMT     Rest Procedure:  Myocardial perfusion imaging was performed at rest 45 minutes following the intravenous administration of Technetium 17m Sestamibi. Rest ECG: NSR-LBBB  Stress Procedure:  The patient received IV adenosine at 140 mcg/kg/min for 4 minutes.  Technetium 45m Sestamibi was injected at the 2 minute mark and quantitative spect  images were obtained after a 45 minute delay.  During the infusion of Adenosine, the patient complained of chest pressure.  This completely resolved in recovery.  Stress ECG: No significant change from baseline ECG  QPS Raw Data Images:  Normal; no motion artifact; normal heart/lung ratio. Stress Images:  Normal homogeneous uptake in all areas of the myocardium. Rest Images:  Normal homogeneous uptake in all areas of the myocardium. Subtraction (SDS):  No evidence of ischemia. Transient Ischemic Dilatation (Normal <1.22):  0.99 Lung/Heart Ratio (Normal <0.45):  0.29  Quantitative Gated Spect Images QGS EDV:  118 ml QGS ESV:  50 ml  Impression Exercise Capacity:  Adenosine study with no exercise. BP Response:  Normal blood pressure response. BP was elevated prior to the test and at the conclusion of the test. Clinical Symptoms:  Chest pressure. ECG Impression:  Baseline:  LBBB.  EKG uninterpretable due to LBBB at rest and stress. Comparison with Prior Nuclear Study: No previous nuclear study performed  Overall Impression:  Low risk stress nuclear study. Hypertension present at beginning and at end of test. EKG shows IVCD of the LBBB type, unchanged with stress. Perfusion imaging shows no ischemia or scar.  LV Ejection Fraction: 58%.  LV Wall Motion:  Normal Wall Motion  Darlin Coco MD

## 2014-04-19 NOTE — Progress Notes (Signed)
Limited echo performed.

## 2014-04-19 NOTE — Assessment & Plan Note (Addendum)
His BP is not well controlled so I have asked him to change the HCTZ to Maxzide He will stay on Benicar and metoprolol

## 2014-04-25 ENCOUNTER — Ambulatory Visit: Payer: Medicare Other | Admitting: Physician Assistant

## 2014-05-02 ENCOUNTER — Encounter: Payer: Self-pay | Admitting: Cardiology

## 2014-05-02 ENCOUNTER — Ambulatory Visit (INDEPENDENT_AMBULATORY_CARE_PROVIDER_SITE_OTHER): Payer: Medicare Other | Admitting: Cardiology

## 2014-05-02 VITALS — BP 134/76 | HR 64 | Ht 71.0 in | Wt 147.0 lb

## 2014-05-02 DIAGNOSIS — I10 Essential (primary) hypertension: Secondary | ICD-10-CM

## 2014-05-02 DIAGNOSIS — I447 Left bundle-branch block, unspecified: Secondary | ICD-10-CM

## 2014-05-02 DIAGNOSIS — R0602 Shortness of breath: Secondary | ICD-10-CM

## 2014-05-02 NOTE — Patient Instructions (Signed)
Your physician recommends that you continue on your current medications as directed. Please refer to the Current Medication list given to you today.  Your physician recommends that you schedule a follow-up appointment in: 3 months with Dr Radford Pax

## 2014-05-02 NOTE — Progress Notes (Signed)
[place], [place], [place]  [address] Phone: [phone] Fax:  [phone]  Date:  05/02/2014   ID:  Jared Rogers, DOB May 26, 1941, MRN [id number]  PCP:  Scarlette Calico, MD  Cardiologist:  Fransico Him, MD     History of Present Illness: Jared Rogers is a 73 y.o. male who is here for followup of SOB and HTN. He says that the SOB has been going on for a few months. The SOB occurs mainly in the daytime when he is moving around. He also gets SOB in the middle of the night. He denies any chest pain or pressure. He denies any palpitations, dizziness or syncope. He has had some LE edema. He no longer uses any added sodium in his diet. He does not speak [place] and all history is obtained through an interpreter who is his brother in Sports [employer]. He recently moved form [place] and lives with his brother in [employer]. He under went 2D echo showing normal LVF.  Nuclear stress test showed no ischemia.  His Olmesartan was increased for HTN but he could not afford it and it was changed to Carvedilol.  He presents back today for followup.  He says that SOB has improved but still has occasional SOB.  He denies any LE edema.     Wt Readings from Last 3 Encounters:  04/19/14 146 lb (66.225 kg)  04/18/14 153 lb (69.4 kg)  04/12/14 150 lb (68.04 kg)     Past Medical History  Diagnosis Date  . Depression   . Hx of migraines   . Alcohol abuse     In past  . GERD (gastroesophageal reflux disease)   . SOB (shortness of breath)     sometimes at night  . PUD (peptic ulcer disease)     with bleeding ulcers and bowel obstruction in 1994 and 1995  . DDD (degenerative disc disease), cervical   . DDD (degenerative disc disease), lumbosacral   . Hyperlipidemia   . Stroke     cerebellar CVA  . Shingles   . Asthmatic bronchitis   . Hypertension     Current Outpatient Prescriptions  Medication Sig Dispense Refill  . aspirin 81 MG tablet Take 81 mg by mouth daily.      . haloperidol (HALDOL) 1  MG tablet Take 1 mg by mouth at bedtime.      . metoprolol tartrate (LOPRESSOR) 25 MG tablet Take 0.5 tablets (12.5 mg total) by mouth 2 (two) times daily.  30 tablet  6  . Multiple Vitamin (MULTIVITAMIN) tablet Take 1 tablet by mouth daily.      Marland Kitchen olmesartan (BENICAR) 40 MG tablet Take 1 tablet (40 mg total) by mouth daily.  30 tablet  5  . triamterene-hydrochlorothiazide (MAXZIDE-25) 37.5-25 MG per tablet Take 1 tablet by mouth daily.  90 tablet  1   No current facility-administered medications for this visit.    Allergies:    Allergies  Allergen Reactions  . Codeine     Social History:  The patient  reports that he has been smoking Cigarettes.  He has been smoking about 0.25 packs per day. He has never used smokeless tobacco. He reports that he does not drink alcohol or use illicit drugs.   Family History:  The patient's family history includes Heart disease in his father; Heart failure in his father; Lung cancer in his sister.   ROS:  Please see the history of present illness.  All other systems reviewed and negative.   PHYSICAL EXAM: VS:  Ht 5\' 11"  (1.803 m) Well nourished, well developed, in no acute distress HEENT: normal Neck: no JVD Cardiac:  normal S1, S2; RRR; no murmur Lungs:  clear to auscultation bilaterally, no wheezing, rhonchi or rales Abd: soft, nontender, no hepatomegaly Ext: no edema Skin: warm and dry Neuro:  CNs 2-12 intact, no focal abnormalities noted  ASSESSMENT AND PLAN:  1.  SOB secondary to poorly controlled HTN. 2D echo showed normal LVF and stress test with no ischemia 2.  LBBB on EKG 3.  HTN with good control - continue metoprolol/Maxid   Followup with me in 3 months      Signed, Fransico Him, MD 05/02/2014 10:05 AM

## 2014-05-16 ENCOUNTER — Ambulatory Visit: Payer: Medicare Other | Admitting: Internal Medicine

## 2014-05-31 ENCOUNTER — Ambulatory Visit: Payer: Medicare Other | Admitting: Internal Medicine

## 2014-06-01 ENCOUNTER — Encounter: Payer: Self-pay | Admitting: Internal Medicine

## 2014-06-01 ENCOUNTER — Other Ambulatory Visit (INDEPENDENT_AMBULATORY_CARE_PROVIDER_SITE_OTHER): Payer: Medicare Other

## 2014-06-01 ENCOUNTER — Ambulatory Visit (INDEPENDENT_AMBULATORY_CARE_PROVIDER_SITE_OTHER): Payer: Medicare Other | Admitting: Internal Medicine

## 2014-06-01 VITALS — BP 142/84 | HR 72 | Temp 98.5°F | Resp 16 | Wt 146.0 lb

## 2014-06-01 DIAGNOSIS — K279 Peptic ulcer, site unspecified, unspecified as acute or chronic, without hemorrhage or perforation: Secondary | ICD-10-CM

## 2014-06-01 DIAGNOSIS — D72829 Elevated white blood cell count, unspecified: Secondary | ICD-10-CM | POA: Insufficient documentation

## 2014-06-01 DIAGNOSIS — D509 Iron deficiency anemia, unspecified: Secondary | ICD-10-CM | POA: Insufficient documentation

## 2014-06-01 DIAGNOSIS — D649 Anemia, unspecified: Secondary | ICD-10-CM

## 2014-06-01 DIAGNOSIS — I10 Essential (primary) hypertension: Secondary | ICD-10-CM

## 2014-06-01 DIAGNOSIS — K219 Gastro-esophageal reflux disease without esophagitis: Secondary | ICD-10-CM | POA: Insufficient documentation

## 2014-06-01 LAB — CBC WITH DIFFERENTIAL/PLATELET
BASOS ABS: 0.2 10*3/uL — AB (ref 0.0–0.1)
Basophils Relative: 1.5 % (ref 0.0–3.0)
EOS PCT: 3.2 % (ref 0.0–5.0)
Eosinophils Absolute: 0.5 10*3/uL (ref 0.0–0.7)
HCT: 38.3 % — ABNORMAL LOW (ref 39.0–52.0)
Hemoglobin: 12.3 g/dL — ABNORMAL LOW (ref 13.0–17.0)
LYMPHS PCT: 19.4 % (ref 12.0–46.0)
Lymphs Abs: 2.9 10*3/uL (ref 0.7–4.0)
MCHC: 32.1 g/dL (ref 30.0–36.0)
MCV: 93.8 fl (ref 78.0–100.0)
MONOS PCT: 12.7 % — AB (ref 3.0–12.0)
Monocytes Absolute: 1.9 10*3/uL — ABNORMAL HIGH (ref 0.1–1.0)
NEUTROS ABS: 9.5 10*3/uL — AB (ref 1.4–7.7)
Neutrophils Relative %: 63.2 % (ref 43.0–77.0)
Platelets: 213 10*3/uL (ref 150.0–400.0)
RBC: 4.08 Mil/uL — ABNORMAL LOW (ref 4.22–5.81)
RDW: 13.2 % (ref 11.5–15.5)
WBC: 15 10*3/uL — AB (ref 4.0–10.5)

## 2014-06-01 LAB — IBC PANEL
Iron: 75 ug/dL (ref 42–165)
SATURATION RATIOS: 24.7 % (ref 20.0–50.0)
TRANSFERRIN: 217.3 mg/dL (ref 212.0–360.0)

## 2014-06-01 LAB — BASIC METABOLIC PANEL
BUN: 17 mg/dL (ref 6–23)
CHLORIDE: 105 meq/L (ref 96–112)
CO2: 30 mEq/L (ref 19–32)
Calcium: 9.1 mg/dL (ref 8.4–10.5)
Creatinine, Ser: 0.9 mg/dL (ref 0.4–1.5)
GFR: 84.76 mL/min (ref >60.00)
Glucose, Bld: 92 mg/dL (ref 70–99)
Potassium: 4.1 mEq/L (ref 3.5–5.1)
SODIUM: 140 meq/L (ref 135–145)

## 2014-06-01 LAB — FERRITIN: Ferritin: 60.9 ng/mL (ref 22.0–322.0)

## 2014-06-01 MED ORDER — FERRALET 90 90-1 MG PO TABS: 1.0000 | ORAL_TABLET | Freq: Every day | ORAL | Status: DC

## 2014-06-01 MED ORDER — OMEPRAZOLE 20 MG PO CPDR: 20.0000 mg | DELAYED_RELEASE_CAPSULE | Freq: Every day | ORAL | Status: DC

## 2014-06-01 NOTE — Progress Notes (Signed)
Pre visit review using our clinic review tool, if applicable. No additional management support is needed unless otherwise documented below in the visit note. 

## 2014-06-01 NOTE — Assessment & Plan Note (Signed)
His CBC looks suspicious for lymphoproliferative disease I have asked him to RTC for further testing (ESR, SPEP, light chains) to screen for lymphoproliferative disease

## 2014-06-01 NOTE — Assessment & Plan Note (Signed)
His BP is well controlled I will monitor his lytes and renal function 

## 2014-06-01 NOTE — Assessment & Plan Note (Signed)
Start a PPI

## 2014-06-01 NOTE — Assessment & Plan Note (Addendum)
My order for B12 and folate levels was blocked by the software I will recheck his CBC and iron levels Start ferralet Work up for lymphoproliferative disease GI work up is in process

## 2014-06-01 NOTE — Progress Notes (Signed)
Subjective:    Patient ID: Jared Rogers, male    DOB: 02/28/1941, 73 y.o.   MRN: [id number]  HPI Comments: His brother-in-law translates for him today.  Gastrophageal Reflux He complains of heartburn. He reports no abdominal pain, no belching, no chest pain, no choking, no coughing, no dysphagia, no early satiety, no globus sensation, no hoarse voice, no nausea, no sore throat, no stridor, no tooth decay, no water brash or no wheezing. This is a chronic problem. The current episode started more than 1 year ago. The problem occurs occasionally. The problem has been unchanged. The heartburn duration is less than a minute. The heartburn is located in the substernum. The heartburn is of moderate intensity. The heartburn does not wake him from sleep. The heartburn does not limit his activity. The heartburn doesn't change with position. Nothing aggravates the symptoms. Associated symptoms include anemia and fatigue. Pertinent negatives include no melena, muscle weakness, orthopnea or weight loss. Risk factors include smoking/tobacco exposure. He has tried nothing for the symptoms. The treatment provided no relief.      Review of Systems  Constitutional: Positive for fatigue. Negative for fever, chills, weight loss, diaphoresis, activity change, appetite change and unexpected weight change.  HENT: Negative.  Negative for hoarse voice and sore throat.   Eyes: Negative.   Respiratory: Negative.  Negative for cough, choking, shortness of breath, wheezing and stridor.   Cardiovascular: Negative.  Negative for chest pain, palpitations and leg swelling.  Gastrointestinal: Positive for heartburn. Negative for dysphagia, nausea, vomiting, abdominal pain, diarrhea, constipation, blood in stool, melena, abdominal distention, anal bleeding and rectal pain.  Endocrine: Negative.   Genitourinary: Negative.   Musculoskeletal: Negative.  Negative for muscle weakness.  Skin: Negative.     Allergic/Immunologic: Negative.   Neurological: Negative.  Negative for dizziness, tremors, weakness, light-headedness and numbness.  Hematological: Negative.  Negative for adenopathy. Does not bruise/bleed easily.  Psychiatric/Behavioral: Negative.        Objective:   Physical Exam  Vitals reviewed. Constitutional: He is oriented to person, place, and time. He appears well-developed and well-nourished. No distress.  HENT:  Head: Normocephalic and atraumatic.  Mouth/Throat: Oropharynx is clear and moist. No oropharyngeal exudate.  Eyes: Conjunctivae are normal. Right eye exhibits no discharge. Left eye exhibits no discharge. No scleral icterus.  Neck: Normal range of motion. Neck supple. No JVD present. No tracheal deviation present. No thyromegaly present.  Cardiovascular: Normal rate, regular rhythm, normal heart sounds and intact distal pulses.  Exam reveals no gallop and no friction rub.   No murmur heard. Pulmonary/Chest: Effort normal and breath sounds normal. No stridor. No respiratory distress. He has no wheezes. He has no rales. He exhibits no tenderness.  Abdominal: Soft. Bowel sounds are normal. He exhibits no distension and no mass. There is no tenderness. There is no rebound and no guarding.  Musculoskeletal: Normal range of motion. He exhibits no edema and no tenderness.  Lymphadenopathy:    He has no cervical adenopathy.  Neurological: He is oriented to person, place, and time.  Skin: Skin is warm and dry. No rash noted. He is not diaphoretic. No erythema. No pallor.  Psychiatric: He has a normal mood and affect. His behavior is normal. Judgment and thought content normal.     Lab Results  Component Value Date   WBC 7.6 02/14/2014   HGB 12.4* 02/14/2014   HCT 39.3 02/14/2014   PLT 199.0 02/14/2014   GLUCOSE 95 04/06/2014   CHOL 194 02/14/2014  TRIG 43.0 02/14/2014   HDL 67.10 02/14/2014   LDLCALC 118* 02/14/2014   ALT 10 02/14/2014   AST 15 02/14/2014   NA 139 04/06/2014   K  4.0 04/06/2014   CL 103 04/06/2014   CREATININE 0.8 04/06/2014   BUN 15 04/06/2014   CO2 30 04/06/2014   TSH 0.93 02/14/2014   PSA 0.72 02/14/2014       Assessment & Plan:

## 2014-06-01 NOTE — Patient Instructions (Signed)
Anemia, Nonspecific Anemia is a condition in which the concentration of red blood cells or hemoglobin in the blood is below normal. Hemoglobin is a substance in red blood cells that carries oxygen to the tissues of the body. Anemia results in not enough oxygen reaching these tissues.  CAUSES  Common causes of anemia include:   Excessive bleeding. Bleeding may be internal or external. This includes excessive bleeding from periods (in women) or from the intestine.   Poor nutrition.   Chronic kidney, thyroid, and liver disease.  Bone marrow disorders that decrease red blood cell production.  Cancer and treatments for cancer.  HIV, AIDS, and their treatments.  Spleen problems that increase red blood cell destruction.  Blood disorders.  Excess destruction of red blood cells due to infection, medicines, and autoimmune disorders. SIGNS AND SYMPTOMS   Minor weakness.   Dizziness.   Headache.  Palpitations.   Shortness of breath, especially with exercise.   Paleness.  Cold sensitivity.  Indigestion.  Nausea.  Difficulty sleeping.  Difficulty concentrating. Symptoms may occur suddenly or they may develop slowly.  DIAGNOSIS  Additional blood tests are often needed. These help your health care provider determine the best treatment. Your health care provider will check your stool for blood and look for other causes of blood loss.  TREATMENT  Treatment varies depending on the cause of the anemia. Treatment can include:   Supplements of iron, vitamin B12, or folic acid.   Hormone medicines.   A blood transfusion. This may be needed if blood loss is severe.   Hospitalization. This may be needed if there is significant continual blood loss.   Dietary changes.  Spleen removal. HOME CARE INSTRUCTIONS Keep all follow-up appointments. It often takes many weeks to correct anemia, and having your health care provider check on your condition and your response to  treatment is very important. SEEK IMMEDIATE MEDICAL CARE IF:   You develop extreme weakness, shortness of breath, or chest pain.   You become dizzy or have trouble concentrating.  You develop heavy vaginal bleeding.   You develop a rash.   You have bloody or black, tarry stools.   You faint.   You vomit up blood.   You vomit repeatedly.   You have abdominal pain.  You have a fever or persistent symptoms for more than 2-3 days.   You have a fever and your symptoms suddenly get worse.   You are dehydrated.  MAKE SURE YOU:  Understand these instructions.  Will watch your condition.  Will get help right away if you are not doing well or get worse. Document Released: 01/09/2005 Document Revised: 08/04/2013 Document Reviewed: 05/28/2013 [employer] Patient Information 2015 [employer]. This information is not intended to replace advice given to you by your health care provider. Make sure you discuss any questions you have with your health care provider.  

## 2014-06-13 ENCOUNTER — Telehealth: Payer: Self-pay | Admitting: Internal Medicine

## 2014-06-13 DIAGNOSIS — F0391 Unspecified dementia with behavioral disturbance: Secondary | ICD-10-CM | POA: Insufficient documentation

## 2014-06-13 DIAGNOSIS — F03918 Unspecified dementia, unspecified severity, with other behavioral disturbance: Secondary | ICD-10-CM

## 2014-06-13 NOTE — Telephone Encounter (Signed)
Neurology referral 

## 2014-06-13 NOTE — Telephone Encounter (Signed)
Amy Dalton, patient's care taker is calling because the patient is refusing to get anymore labs done, go to the doctor, or take his medicine. He hides his medicine when he is given it to take.  Amy says she is not sure what to do because he seems to think that everyone is out to "get him."  She believes it may be an early stage of dementia. She wants to know what Dr. Ronnald Ramp may recommend they do.

## 2014-06-15 NOTE — Telephone Encounter (Signed)
Pt is aware of appt date and time.

## 2014-06-22 ENCOUNTER — Encounter: Payer: Self-pay | Admitting: Neurology

## 2014-06-22 ENCOUNTER — Ambulatory Visit (INDEPENDENT_AMBULATORY_CARE_PROVIDER_SITE_OTHER): Payer: Medicare Other | Admitting: Neurology

## 2014-06-22 VITALS — BP 160/80 | HR 70 | Ht 71.26 in | Wt 147.0 lb

## 2014-06-22 DIAGNOSIS — R413 Other amnesia: Secondary | ICD-10-CM

## 2014-06-22 DIAGNOSIS — F22 Delusional disorders: Secondary | ICD-10-CM

## 2014-06-22 NOTE — Progress Notes (Signed)
NEUROLOGY CONSULTATION NOTE  Jared Rogers MRN: [id number] DOB: 17-May-1941  Referring provider: Dr. Scarlette Calico Primary care provider: Dr. Scarlette Calico  Reason for consult:  Dementia, refusing treatment  Dear Dr Ronnald Ramp:  Thank you for your kind referral of Jared Rogers for consultation of the above symptoms. Although his history is well known to you, please allow me to reiterate it for the purpose of our medical record. The patient was accompanied to the clinic by his brother-in-law who also provides collateral information. Spanish interpreter was present to help with translation.  History is somewhat limited due to the patient and his brother-in-law being poor historians.  They would start arguing when his brother-in-law starts translating what he tells me.  Records and images were personally reviewed where available.  HISTORY OF PRESENT ILLNESS: This is a pleasant 73 year old right-handed Spanish-speaking man with a history of hypertension, hyperlipidemia, cerebellar stroke, presenting for evaluation of possible dementia after his caregiver had called regarding concern for this, stating he was refusing to get anymore labs done, go to the doctor, or take his medicine. He hides his medicine when he is given it to take. She reported she is not sure what to do because he seems to think that everyone is out to "get him."   Mr. Louretta Rogers reports that his memory is "fine, sometimes gone, but not everyday."  He states he does not have a driver's license but occasionally drives and does not get lost.  He lives with his brother-in-law.  They both deny any significant memory changes, he helps his brother-in-law with cooking, he does not ask repeated questions excessively or get turned around in their house. His brother-in-law states that the main concern is the patient's paranoia, telling him that people and spirits have poisoned the air.  One time they were at a doctor's office and he suddenly  wanted to leave because he felt an electric shock in his heart and was afraid staff would give him an electric shock.  Over the course of the visit, the patient became more open about his symptoms, stating that "they did something in my body" in his home [place] 23 years ago.  He states that "you can't see the people, they can take the memory."  He states that he would feel a heat in his back, his legs would hurt, and that "they put something in my body."  He thinks that people do not believe him when he says these, but he knows it is true.  He has episodes where he feels momentarily depressed, "a gas comes suddenly not from any place, I can feel it."  The last time this occurred was 2 weeks ago.  He has occasional numbness in both hands and feet lasting 2 minutes.  His brother denies any staring/unresponsive episodes, no myoclonic jerks.  He has occasional mild left frontal headaches with no associated nausea, vomiting, vision changes. He has occasional dizziness in the morning where he feels weak for a few minutes. He states this occurs when he opens the doors and that the dizziness "comes from outside."  He had bloodwork done recently, showing an elevated WBC suspicious for lymphoproliferative disease, however the patient has refused to have further bloodwork done.  He shows me a copy of his bloodwork, and his brother-in-law states that he thinks his results were switched with a different person and that these are not his.  When asked about refusing to take medications, he denies this, stating that he  does not want to take the "cod oil" because it is for colds and he only would take it 3x/week.    Laboratory Data: Lab Results  Component Value Date   WBC 15.0* 06/01/2014   HGB 12.3* 06/01/2014   HCT 38.3* 06/01/2014   MCV 93.8 06/01/2014   PLT 213.0 06/01/2014     Chemistry      Component Value Date/Time   NA 140 06/01/2014 0929   K 4.1 06/01/2014 0929   CL 105 06/01/2014 0929   CO2 30 06/01/2014 0929    BUN 17 06/01/2014 0929   CREATININE 0.9 06/01/2014 0929      Component Value Date/Time   CALCIUM 9.1 06/01/2014 0929   ALKPHOS 77 02/14/2014 1049   AST 15 02/14/2014 1049   ALT 10 02/14/2014 1049   BILITOT 1.1 02/14/2014 1049     Lab Results  Component Value Date   TSH 0.93 02/14/2014    PAST MEDICAL HISTORY: Past Medical History  Diagnosis Date  . Depression   . Hx of migraines   . Alcohol abuse     In past  . GERD (gastroesophageal reflux disease)   . SOB (shortness of breath)     sometimes at night  . PUD (peptic ulcer disease)     with bleeding ulcers and bowel obstruction in 1994 and 1995  . DDD (degenerative disc disease), cervical   . DDD (degenerative disc disease), lumbosacral   . Hyperlipidemia   . Stroke     cerebellar CVA  . Shingles   . Asthmatic bronchitis   . Hypertension     PAST SURGICAL HISTORY: History reviewed. No pertinent past surgical history.  MEDICATIONS: Current Outpatient Prescriptions on File Prior to Visit  Medication Sig Dispense Refill  . aspirin 81 MG tablet Take 81 mg by mouth daily.      Marland Kitchen Fe Cbn-Fe Gluc-FA-B12-C-DSS (FERRALET 90) 90-1 MG TABS Take 1 tablet by mouth daily.  90 each  3  . haloperidol (HALDOL) 1 MG tablet Take 1 mg by mouth at bedtime.      . metoprolol tartrate (LOPRESSOR) 25 MG tablet Take 0.5 tablets (12.5 mg total) by mouth 2 (two) times daily.  30 tablet  6  . Multiple Vitamin (MULTIVITAMIN) tablet Take 1 tablet by mouth daily.      Marland Kitchen omeprazole (PRILOSEC) 20 MG capsule Take 1 capsule (20 mg total) by mouth daily.  30 capsule  3  . triamterene-hydrochlorothiazide (MAXZIDE-25) 37.5-25 MG per tablet Take 1 tablet by mouth daily.  90 tablet  1   No current facility-administered medications on file prior to visit.    ALLERGIES: Allergies  Allergen Reactions  . Codeine     FAMILY HISTORY: Family History  Problem Relation Age of Onset  . Heart disease Father   . Heart failure Father   . Lung cancer Sister      SOCIAL HISTORY: History   Social History  . Marital Status: Widowed    Spouse Name: N/A    Number of Children: N/A  . Years of Education: N/A   Occupational History  . Not on file.   Social History Main Topics  . Smoking status: Current Every Day Smoker -- 0.25 packs/day    Types: Cigarettes  . Smokeless tobacco: Never Used     Comment: Pt smokes 5-6 cigarettes a day  . Alcohol Use: No  . Drug Use: No  . Sexual Activity: Not Currently   Other Topics Concern  . Not on file  Social History Narrative  . No narrative on file    REVIEW OF SYSTEMS: Constitutional: No fevers, chills, or sweats, no generalized fatigue, change in appetite Eyes: No visual changes, double vision, eye pain Ear, nose and throat: No hearing loss, ear pain, nasal congestion, sore throat Cardiovascular: No chest pain, palpitations Respiratory:  No shortness of breath at rest or with exertion, wheezes GastrointestinaI: No nausea, vomiting, diarrhea, abdominal pain, fecal incontinence Genitourinary:  No dysuria, urinary retention or frequency Musculoskeletal:  No neck pain, back pain Integumentary: No rash, pruritus, skin lesions Neurological: as above Psychiatric: No depression, insomnia, anxiety Endocrine: No palpitations, fatigue, diaphoresis, mood swings, change in appetite, change in weight, increased thirst Hematologic/Lymphatic:  No anemia, purpura, petechiae. Allergic/Immunologic: no itchy/runny eyes, nasal congestion, recent allergic reactions, rashes  PHYSICAL EXAM: Filed Vitals:   06/22/14 1234  BP: 160/80  Pulse: 70   General: No acute distress Head:  Normocephalic/atraumatic Eyes: Fundoscopic exam shows bilateral sharp discs, no vessel changes, exudates, or hemorrhages Neck: supple, no paraspinal tenderness, full range of motion Back: No paraspinal tenderness Heart: regular rate and rhythm Lungs: Clear to auscultation bilaterally. Vascular: No carotid  bruits. Skin/Extremities: No rash, no edema Neurological Exam: Mental status: alert and oriented to person, place, and time, no dysarthria or aphasia, Fund of knowledge is appropriate.  Recent and remote memory are intact, able to recall 3/3 objects after 5 minutes.  Attention and concentration are normal.    Able to name objects and repeat phrases. His drawing of intersecting pentagons did not intersect. Cranial nerves: CN I: not tested CN II: pupils equal, round and reactive to light, visual fields intact, fundi unremarkable. CN III, IV, VI:  full range of motion, no nystagmus, no ptosis CN V: facial sensation intact CN VII: upper and lower face symmetric CN VIII: hearing intact to finger rub CN IX, X: gag intact, uvula midline CN XI: sternocleidomastoid and trapezius muscles intact CN XII: tongue midline Bulk & Tone: normal, no fasciculations. Motor: 5/5 throughout with no pronator drift. Sensation: reports decreased cold on the left LE, otherwise intact to light touch, pin, vibration and joint position sense.  No extinction to double simultaneous stimulation.  Romberg test negative Deep Tendon Reflexes: +2 throughout, no ankle clonus Plantar responses: downgoing bilaterally Cerebellar: no incoordination on finger to nose, heel to shin. No dysdiadochokinesia Gait: narrow-based and steady, able to tandem walk adequately. Tremor: none  IMPRESSION: This is a 73 year old right-handed Spanish-speaking man with a history of hypertension, hyperlipidemia, and prior stroke, presenting for evaluation of possible dementia after he started refusing medications and bloodwork.  He is a poor historian and appears to have paranoia and delusions that his memory had been taken in his home country many years ago, and that "they put something in me."  He talks about "people and spirits that poison the air."  It is unclear if this is new or a cultural belief of spirits, he does endorse recurrent episodes of  possible olfactory hallucinations, numbness, and headaches.  MRI brain without contrast will be ordered to assess for underlying structural abnormality.  I have discussed with him the abnormal bloodwork results, he thinks results have been switched and these are not his, I explained this is unlikely and the need for further tests.  He expressed understanding and will follow-up with his PCP and return to clinic after the MRI.  Thank you for allowing me to participate in the care of this patient. Please do not hesitate to call for  any questions or concerns.   Ellouise Newer, M.D.  CC: Dr. Ronnald Ramp

## 2014-06-22 NOTE — Patient Instructions (Signed)
1. MRI brain without contrast 2. Follow-up after scan

## 2014-06-23 ENCOUNTER — Telehealth: Payer: Self-pay | Admitting: *Deleted

## 2014-06-23 MED ORDER — TRIAMTERENE-HCTZ 37.5-25 MG PO TABS: 1.0000 | ORAL_TABLET | Freq: Every day | ORAL | Status: DC

## 2014-06-23 NOTE — Telephone Encounter (Signed)
Caregiver stated pt is needing a refill on his HCTZ 12.5 mg. Inform her at last visit md change med to combination med. Triamterene/HCTZ due to BP being elevated. She stated they didn't get rx inform her will resend to [facility]...Jared Rogers

## 2014-07-08 ENCOUNTER — Ambulatory Visit (HOSPITAL_COMMUNITY)
Admission: RE | Admit: 2014-07-08 | Discharge: 2014-07-08 | Disposition: A | Discharge status: Home / Self Care | Payer: Medicare Other | Source: Ambulatory Visit | Attending: Neurology | Admitting: Neurology

## 2014-07-08 DIAGNOSIS — R413 Other amnesia: Secondary | ICD-10-CM

## 2014-07-08 DIAGNOSIS — I6789 Other cerebrovascular disease: Secondary | ICD-10-CM | POA: Diagnosis not present

## 2014-07-08 DIAGNOSIS — G319 Degenerative disease of nervous system, unspecified: Secondary | ICD-10-CM | POA: Insufficient documentation

## 2014-07-08 DIAGNOSIS — F22 Delusional disorders: Secondary | ICD-10-CM | POA: Diagnosis present

## 2014-08-02 ENCOUNTER — Ambulatory Visit (INDEPENDENT_AMBULATORY_CARE_PROVIDER_SITE_OTHER): Payer: Medicare Other | Admitting: Cardiology

## 2014-08-02 ENCOUNTER — Encounter: Payer: Self-pay | Admitting: Cardiology

## 2014-08-02 VITALS — BP 171/92 | HR 76 | Ht 71.6 in | Wt 147.8 lb

## 2014-08-02 DIAGNOSIS — I447 Left bundle-branch block, unspecified: Secondary | ICD-10-CM

## 2014-08-02 DIAGNOSIS — I10 Essential (primary) hypertension: Secondary | ICD-10-CM

## 2014-08-02 DIAGNOSIS — R0602 Shortness of breath: Secondary | ICD-10-CM | POA: Insufficient documentation

## 2014-08-02 MED ORDER — METOPROLOL TARTRATE 25 MG PO TABS: 25.0000 mg | ORAL_TABLET | Freq: Two times a day (BID) | ORAL | Status: DC

## 2014-08-02 NOTE — Progress Notes (Signed)
[address], [place], [place]  [address] Phone: [phone] Fax:  [phone]  Date:  08/02/2014   ID:  Jared Rogers, DOB [birth year], MRN [id number]  PCP:  Scarlette Calico, MD  Cardiologist:  Fransico Him, MD     History of Present Illness: Jared Rogers is a 73 y.o. male who is here for followup of SOB and HTN. He says that the SOB has been going on for a few months. The SOB occurs mainly in the daytime when he is moving around. He also gets SOB in the middle of the night. He denies any chest pain or pressure. He denies any palpitations, dizziness or syncope. He has had some LE edema. He no longer uses any added sodium in his diet. He does not speak [place] and all history is obtained through an interpreter who is his brother in [employer]. He recently moved form [place] and lives with his brother in [employer]. He under went 2D echo showing normal LVF. Nuclear stress test showed no ischemia. His Olmesartan was increased for HTN but he could not afford it and it was changed to Carvedilol. He presents back today for followup. He is still having some problems with SOB.  He denies any  LE edema, dizziness or syncope.   Wt Readings from Last 3 Encounters:  08/02/14 147 lb 12.8 oz (67.042 kg)  06/22/14 147 lb (66.679 kg)  06/01/14 146 lb (66.225 kg)     Past Medical History  Diagnosis Date  . Depression   . Hx of migraines   . Alcohol abuse     In past  . GERD (gastroesophageal reflux disease)   . SOB (shortness of breath)     sometimes at night  . PUD (peptic ulcer disease)     with bleeding ulcers and bowel obstruction in 1994 and 1995  . DDD (degenerative disc disease), cervical   . DDD (degenerative disc disease), lumbosacral   . Hyperlipidemia   . Stroke     cerebellar CVA  . Shingles   . Asthmatic bronchitis   . Hypertension     Current Outpatient Prescriptions  Medication Sig Dispense Refill  . aspirin 81 MG tablet Take 81 mg by mouth daily.      Marland Kitchen Fe Cbn-Fe  Gluc-FA-B12-C-DSS (FERRALET 90) 90-1 MG TABS Take 1 tablet by mouth daily.  90 each  3  . haloperidol (HALDOL) 1 MG tablet Take 1 mg by mouth at bedtime.      . metoprolol tartrate (LOPRESSOR) 25 MG tablet Take 0.5 tablets (12.5 mg total) by mouth 2 (two) times daily.  30 tablet  6  . Multiple Vitamin (MULTIVITAMIN) tablet Take 1 tablet by mouth daily.      Marland Kitchen omeprazole (PRILOSEC) 20 MG capsule Take 1 capsule (20 mg total) by mouth daily.  30 capsule  3  . triamterene-hydrochlorothiazide (MAXZIDE-25) 37.5-25 MG per tablet Take 1 tablet by mouth daily.  30 tablet  5   No current facility-administered medications for this visit.    Allergies:    Allergies  Allergen Reactions  . Codeine     Social History:  The patient  reports that he has been smoking Cigarettes.  He has been smoking about 0.25 packs per day. He has never used smokeless tobacco. He reports that he does not drink alcohol or use illicit drugs.   Family History:  The patient's family history includes Heart disease in his father; Heart failure in his father; Lung cancer in  his sister.   ROS:  Please see the history of present illness.      All other systems reviewed and negative.   PHYSICAL EXAM: VS:  BP 171/92  Pulse 76  Ht 5' 11.6" (1.819 m)  Wt 147 lb 12.8 oz (67.042 kg)  BMI 20.26 kg/m2 Well nourished, well developed, in no acute distress HEENT: normal Neck: no JVD Cardiac:  normal S1, S2; RRR; no murmur Lungs:  clear to auscultation bilaterally, no wheezing, rhonchi or rales Abd: soft, nontender, no hepatomegaly Ext: no edema Skin: warm and dry Neuro:  CNs 2-12 intact, no focal abnormalities noted  EKG:  NSR with nonspecific IVCD  ASSESSMENT AND PLAN:  1. SOB secondary to poorly controlled HTN. 2D echo showed normal LVF and stress test with no ischemia  2. LBBB on EKG  3. HTN with good control  - increase metoprolol to 25mg  BID - continue Maxide - followup with nurse in 1 week for BP check   Followup  with me in 6 months      Signed, Fransico Him, MD 08/02/2014 8:08 AM

## 2014-08-02 NOTE — Patient Instructions (Signed)
Your physician has recommended you make the following change in your medication: 1. Increase Metoprolol to 25 MG 1 tablet Twice a day  Your physician recommends that you schedule a follow-up appointment with the Nurse first available. Please schedule before leaving today.  Your physician wants you to follow-up in: 6 months with Dr Mallie Snooks will receive a reminder letter in the mail two months in advance. If you don't receive a letter, please call our office to schedule the follow-up appointment.

## 2014-11-25 ENCOUNTER — Encounter: Payer: Self-pay | Admitting: Cardiology

## 2014-12-13 ENCOUNTER — Encounter: Payer: Self-pay | Admitting: Cardiology

## 2015-01-19 ENCOUNTER — Encounter: Payer: Self-pay | Admitting: Cardiology

## 2015-03-01 ENCOUNTER — Telehealth: Payer: Self-pay

## 2015-03-01 NOTE — Telephone Encounter (Signed)
LVM for pt to call back.   RE: flu vaccine for 2015/2016 season.

## 2015-07-13 ENCOUNTER — Encounter: Payer: Self-pay | Admitting: Family Medicine

## 2015-07-13 ENCOUNTER — Telehealth: Payer: Self-pay

## 2015-07-13 ENCOUNTER — Ambulatory Visit (INDEPENDENT_AMBULATORY_CARE_PROVIDER_SITE_OTHER): Payer: PPO

## 2015-07-13 ENCOUNTER — Ambulatory Visit (INDEPENDENT_AMBULATORY_CARE_PROVIDER_SITE_OTHER): Payer: PPO | Admitting: Family Medicine

## 2015-07-13 VITALS — BP 150/76 | HR 104 | Ht 71.6 in | Wt 131.0 lb

## 2015-07-13 DIAGNOSIS — R911 Solitary pulmonary nodule: Secondary | ICD-10-CM

## 2015-07-13 DIAGNOSIS — E46 Unspecified protein-calorie malnutrition: Secondary | ICD-10-CM | POA: Insufficient documentation

## 2015-07-13 DIAGNOSIS — R634 Abnormal weight loss: Secondary | ICD-10-CM

## 2015-07-13 DIAGNOSIS — J439 Emphysema, unspecified: Secondary | ICD-10-CM

## 2015-07-13 DIAGNOSIS — R918 Other nonspecific abnormal finding of lung field: Secondary | ICD-10-CM | POA: Diagnosis not present

## 2015-07-13 DIAGNOSIS — R109 Unspecified abdominal pain: Secondary | ICD-10-CM

## 2015-07-13 LAB — LIPID PANEL
CHOL/HDL RATIO: 3.5 ratio (ref <=5.0)
Cholesterol: 140 mg/dL (ref 125–200)
HDL: 40 mg/dL (ref >=40)
LDL CALC: 86 mg/dL (ref <130)
Triglycerides: 71 mg/dL (ref <150)
VLDL: 14 mg/dL (ref <30)

## 2015-07-13 LAB — COMPLETE METABOLIC PANEL WITH GFR
ALK PHOS: 88 U/L (ref 40–115)
ALT: 17 U/L (ref 9–46)
AST: 23 U/L (ref 10–35)
Albumin: 2.6 g/dL — ABNORMAL LOW (ref 3.6–5.1)
BUN: 13 mg/dL (ref 7–25)
CALCIUM: 8.6 mg/dL (ref 8.6–10.3)
CO2: 21 mEq/L (ref 20–31)
Chloride: 101 mEq/L (ref 98–110)
Creat: 0.73 mg/dL (ref 0.70–1.18)
GFR, Est African American: >89 mL/min (ref >=60)
GFR, Est Non African American: >89 mL/min (ref >=60)
Glucose, Bld: 92 mg/dL (ref 65–99)
POTASSIUM: 3.8 meq/L (ref 3.5–5.3)
SODIUM: 138 meq/L (ref 135–146)
Total Bilirubin: 0.6 mg/dL (ref 0.2–1.2)
Total Protein: 8 g/dL (ref 6.1–8.1)

## 2015-07-13 LAB — TSH: TSH: 1.365 u[IU]/mL (ref 0.350–4.500)

## 2015-07-13 MED ORDER — MEGESTROL ACETATE 400 MG/10ML PO SUSP: 400.0000 mg | Freq: Every day | ORAL | Status: DC

## 2015-07-13 MED ORDER — OMEPRAZOLE 40 MG PO CPDR: 40.0000 mg | DELAYED_RELEASE_CAPSULE | Freq: Every day | ORAL | Status: DC

## 2015-07-13 NOTE — Assessment & Plan Note (Signed)
Unintentional weight loss. This is an ominous finding before the lung x-ray results were back. However with the concern for primary lung malignancy it is even worse. I'm concerned he has some malignancy. He does not have a good functional status at this time. A metabolic panel as well as other tests are pending. Will obtain a CT scan with contrast when his creatinine results are back from his metabolic panel. He is a follow-up appointment with me in one week. This will be a good time to discuss all the findings. We'll start omeprazole and Megace.

## 2015-07-13 NOTE — Patient Instructions (Signed)
Thank you for coming in today. Return in 1 week.  Start omeprazole for stomach.  Start megace for appetite.  Think about advanced directive and DNR.

## 2015-07-13 NOTE — Telephone Encounter (Signed)
CT showed 75m nodule density right upper lobe concerning for primary lung neoplasm recommending ct chest with contrast.

## 2015-07-13 NOTE — Progress Notes (Signed)
Jared Rogers is a 74 y.o. male who presents to [facility]  today for establish care and discuss unintentional weight loss. Patient is a Spanish-speaking 74 year old man who has a history of medical nonadherence. This was thought last year to possibly due to some mild underlying dementia and likely due to view health outlook issues. He does not take any medicines currently. His chief issues today are decreasing appetite and a 20 pound unintentional weight loss over the past several months. He does not eat much as he just does not have an appetite. His brother-in-law who translates for him states that he has tried using ensure which he does not like. When asked he does not think he would like resuscitation attempts but does not have a formal DO NOT RESUSCITATE.   Past Medical History  Diagnosis Date  . Depression   . Hx of migraines   . Alcohol abuse     In past  . GERD (gastroesophageal reflux disease)   . SOB (shortness of breath)     sometimes at night  . PUD (peptic ulcer disease)     with bleeding ulcers and bowel obstruction in 1994 and 1995  . DDD (degenerative disc disease), cervical   . DDD (degenerative disc disease), lumbosacral   . Hyperlipidemia   . Stroke     cerebellar CVA  . Shingles   . Asthmatic bronchitis   . Hypertension    History reviewed. No pertinent past surgical history. History  Substance Use Topics  . Smoking status: Current Every Day Smoker -- 0.25 packs/day    Types: Cigarettes  . Smokeless tobacco: Never Used     Comment: Pt smokes 5-6 cigarettes a day  . Alcohol Use: No   ROS as above Medications: Current Outpatient Prescriptions  Medication Sig Dispense Refill  . megestrol (MEGACE) 400 MG/10ML suspension Take 10 mLs (400 mg total) by mouth daily. 240 mL 1  . omeprazole (PRILOSEC) 40 MG capsule Take 1 capsule (40 mg total) by mouth daily. 30 capsule 3   No current facility-administered medications for this  visit.   Allergies  Allergen Reactions  . Codeine      Exam:  BP 150/76 mmHg  Pulse 104  Ht 5' 11.6" (1.819 m)  Wt 131 lb (59.421 kg)  BMI 17.96 kg/m2 Gen:  thin cachectic appearing man HEENT: EOMI,  MMM Lungs: Normal work of breathing. CTABL Heart: RRR no MRG Abd: NABS, Soft. Nondistended, Nontender Exts: Brisk capillary refill, warm and well perfused.   Patient uses the walls to help balance with ambulation  No results found for this or any previous visit (from the past 24 hour(s)). Dg Chest 2 View  07/13/2015   CLINICAL DATA:  Abdominal pain.  Unintentional weight loss.  EXAM: CHEST - 2 VIEW  COMPARISON:  None.  FINDINGS: Heart size is normal. Lungs are hyperexpanded, suggesting emphysema. Nodular density over the right chest likely represents a nipple shadow additional nodule is seen in the right upper lobe measuring at 2.3 cm maximally. The left lung is clear. Atherosclerotic calcifications are present at the aortic arch. The visualized soft tissues and bony thorax are unremarkable.  IMPRESSION: 1. 2 cm nodular density at the right upper lobe. This is concerning for a primary lung neoplasm. Recommend CT of the chest with contrast for further evaluation. 2. Additional lower nodular density likely represents a nipple shadow but can also be evaluated by CT. 3. Emphysema. These results will be called to the  ordering clinician or representative by the Radiologist Assistant, and communication documented in the PACS or zVision Dashboard.   Electronically Signed   By: San Morelle M.D.   On: 07/13/2015 10:12   Dg Abd 2 Views  07/13/2015   CLINICAL DATA:  Abdominal pain with unintentional weight loss  EXAM: ABDOMEN - 2 VIEW  COMPARISON:  None.  FINDINGS: Supine and upright images obtained. There is diffuse stool throughout the colon and rectum. There is no bowel dilatation or air-fluid level suggesting obstruction. No free air. Lung bases are clear. There are surgical clips in the  right upper quadrant region. There are vascular calcifications in the pelvis.  IMPRESSION: Diffuse stool throughout colon and rectum. No bowel obstruction. No free air. Lung bases clear. Nipple shadows noted incidentally bilaterally.   Electronically Signed   By: Lowella Grip III M.D.   On: 07/13/2015 10:11     Please see individual assessment and plan sections.

## 2015-07-14 LAB — CBC WITH DIFFERENTIAL/PLATELET
Basophils Absolute: 0 10*3/uL (ref 0.0–0.1)
Basophils Relative: 0 % (ref 0–1)
Eosinophils Absolute: 0.2 10*3/uL (ref 0.0–0.7)
Eosinophils Relative: 2 % (ref 0–5)
HCT: 25.5 % — ABNORMAL LOW (ref 39.0–52.0)
HEMOGLOBIN: 7.8 g/dL — AB (ref 13.0–17.0)
Lymphocytes Relative: 14 % (ref 12–46)
Lymphs Abs: 1.5 10*3/uL (ref 0.7–4.0)
MCH: 25.2 pg — AB (ref 26.0–34.0)
MCHC: 30.6 g/dL (ref 30.0–36.0)
MCV: 82.5 fL (ref 78.0–100.0)
MONO ABS: 1.5 10*3/uL — AB (ref 0.1–1.0)
MPV: 9.7 fL (ref 8.6–12.4)
Monocytes Relative: 14 % — ABNORMAL HIGH (ref 3–12)
NEUTROS PCT: 70 % (ref 43–77)
Neutro Abs: 7.4 10*3/uL (ref 1.7–7.7)
Platelets: 344 10*3/uL (ref 150–400)
RBC: 3.09 MIL/uL — ABNORMAL LOW (ref 4.22–5.81)
RDW: 17.8 % — ABNORMAL HIGH (ref 11.5–15.5)
WBC: 10.5 10*3/uL (ref 4.0–10.5)

## 2015-07-14 NOTE — Telephone Encounter (Signed)
Imaging authorized.

## 2015-07-17 ENCOUNTER — Telehealth: Payer: Self-pay | Admitting: Family Medicine

## 2015-07-17 NOTE — Telephone Encounter (Signed)
Received fax for Pa on Megestrol [facility] sent through cover my meds waiting on prior authorization. - CF

## 2015-07-18 ENCOUNTER — Inpatient Hospital Stay (HOSPITAL_COMMUNITY): Payer: PPO

## 2015-07-18 ENCOUNTER — Other Ambulatory Visit: Payer: Self-pay

## 2015-07-18 ENCOUNTER — Encounter (HOSPITAL_COMMUNITY): Payer: Self-pay | Admitting: Emergency Medicine

## 2015-07-18 ENCOUNTER — Inpatient Hospital Stay (HOSPITAL_COMMUNITY)
Admission: EM | Admit: 2015-07-18 | Discharge: 2015-07-21 | DRG: 987 | Disposition: A | Discharge status: Home Health | Payer: PPO | Attending: Internal Medicine | Admitting: Internal Medicine

## 2015-07-18 ENCOUNTER — Encounter (HOSPITAL_BASED_OUTPATIENT_CLINIC_OR_DEPARTMENT_OTHER): Payer: Self-pay

## 2015-07-18 ENCOUNTER — Ambulatory Visit (HOSPITAL_BASED_OUTPATIENT_CLINIC_OR_DEPARTMENT_OTHER)
Admission: RE | Admit: 2015-07-18 | Discharge: 2015-07-18 | Disposition: A | Discharge status: Home / Self Care | Payer: PPO | Source: Ambulatory Visit | Attending: Family Medicine | Admitting: Family Medicine

## 2015-07-18 ENCOUNTER — Telehealth: Payer: Self-pay

## 2015-07-18 DIAGNOSIS — D6859 Other primary thrombophilia: Secondary | ICD-10-CM | POA: Diagnosis present

## 2015-07-18 DIAGNOSIS — K219 Gastro-esophageal reflux disease without esophagitis: Secondary | ICD-10-CM | POA: Diagnosis present

## 2015-07-18 DIAGNOSIS — J189 Pneumonia, unspecified organism: Secondary | ICD-10-CM | POA: Diagnosis not present

## 2015-07-18 DIAGNOSIS — I82432 Acute embolism and thrombosis of left popliteal vein: Secondary | ICD-10-CM | POA: Diagnosis present

## 2015-07-18 DIAGNOSIS — I10 Essential (primary) hypertension: Secondary | ICD-10-CM | POA: Diagnosis present

## 2015-07-18 DIAGNOSIS — F0391 Unspecified dementia with behavioral disturbance: Secondary | ICD-10-CM | POA: Diagnosis present

## 2015-07-18 DIAGNOSIS — Z720 Tobacco use: Secondary | ICD-10-CM | POA: Diagnosis present

## 2015-07-18 DIAGNOSIS — C770 Secondary and unspecified malignant neoplasm of lymph nodes of head, face and neck: Secondary | ICD-10-CM | POA: Diagnosis present

## 2015-07-18 DIAGNOSIS — I82441 Acute embolism and thrombosis of right tibial vein: Secondary | ICD-10-CM | POA: Diagnosis present

## 2015-07-18 DIAGNOSIS — Z681 Body mass index (BMI) 19 or less, adult: Secondary | ICD-10-CM

## 2015-07-18 DIAGNOSIS — C3490 Malignant neoplasm of unspecified part of unspecified bronchus or lung: Principal | ICD-10-CM | POA: Diagnosis present

## 2015-07-18 DIAGNOSIS — R918 Other nonspecific abnormal finding of lung field: Secondary | ICD-10-CM

## 2015-07-18 DIAGNOSIS — F17210 Nicotine dependence, cigarettes, uncomplicated: Secondary | ICD-10-CM | POA: Diagnosis present

## 2015-07-18 DIAGNOSIS — R2681 Unsteadiness on feet: Secondary | ICD-10-CM | POA: Diagnosis present

## 2015-07-18 DIAGNOSIS — R52 Pain, unspecified: Secondary | ICD-10-CM | POA: Diagnosis not present

## 2015-07-18 DIAGNOSIS — R634 Abnormal weight loss: Secondary | ICD-10-CM | POA: Diagnosis present

## 2015-07-18 DIAGNOSIS — I82412 Acute embolism and thrombosis of left femoral vein: Secondary | ICD-10-CM | POA: Diagnosis present

## 2015-07-18 DIAGNOSIS — Z8711 Personal history of peptic ulcer disease: Secondary | ICD-10-CM

## 2015-07-18 DIAGNOSIS — I313 Pericardial effusion (noninflammatory): Secondary | ICD-10-CM

## 2015-07-18 DIAGNOSIS — Z8673 Personal history of transient ischemic attack (TIA), and cerebral infarction without residual deficits: Secondary | ICD-10-CM

## 2015-07-18 DIAGNOSIS — R911 Solitary pulmonary nodule: Secondary | ICD-10-CM | POA: Insufficient documentation

## 2015-07-18 DIAGNOSIS — D509 Iron deficiency anemia, unspecified: Secondary | ICD-10-CM | POA: Diagnosis present

## 2015-07-18 DIAGNOSIS — I5042 Chronic combined systolic (congestive) and diastolic (congestive) heart failure: Secondary | ICD-10-CM | POA: Diagnosis present

## 2015-07-18 DIAGNOSIS — E785 Hyperlipidemia, unspecified: Secondary | ICD-10-CM | POA: Diagnosis present

## 2015-07-18 DIAGNOSIS — I447 Left bundle-branch block, unspecified: Secondary | ICD-10-CM | POA: Diagnosis present

## 2015-07-18 DIAGNOSIS — Z7982 Long term (current) use of aspirin: Secondary | ICD-10-CM

## 2015-07-18 DIAGNOSIS — R591 Generalized enlarged lymph nodes: Secondary | ICD-10-CM | POA: Diagnosis present

## 2015-07-18 DIAGNOSIS — I2699 Other pulmonary embolism without acute cor pulmonale: Secondary | ICD-10-CM | POA: Diagnosis not present

## 2015-07-18 DIAGNOSIS — Z9119 Patient's noncompliance with other medical treatment and regimen: Secondary | ICD-10-CM | POA: Diagnosis present

## 2015-07-18 DIAGNOSIS — D649 Anemia, unspecified: Secondary | ICD-10-CM | POA: Diagnosis present

## 2015-07-18 DIAGNOSIS — E43 Unspecified severe protein-calorie malnutrition: Secondary | ICD-10-CM | POA: Diagnosis present

## 2015-07-18 DIAGNOSIS — F03918 Unspecified dementia, unspecified severity, with other behavioral disturbance: Secondary | ICD-10-CM | POA: Diagnosis present

## 2015-07-18 LAB — IRON AND TIBC
Iron: 12 ug/dL — ABNORMAL LOW (ref 45–182)
SATURATION RATIOS: 7 % — AB (ref 17.9–39.5)
TIBC: 179 ug/dL — AB (ref 250–450)
UIBC: 167 ug/dL

## 2015-07-18 LAB — CBC WITH DIFFERENTIAL/PLATELET
BASOS ABS: 0 10*3/uL (ref 0.0–0.1)
Basophils Relative: 0 % (ref 0–1)
EOS PCT: 1 % (ref 0–5)
Eosinophils Absolute: 0.1 10*3/uL (ref 0.0–0.7)
HCT: 24.9 % — ABNORMAL LOW (ref 39.0–52.0)
Hemoglobin: 7.7 g/dL — ABNORMAL LOW (ref 13.0–17.0)
Lymphocytes Relative: 14 % (ref 12–46)
Lymphs Abs: 1.4 10*3/uL (ref 0.7–4.0)
MCH: 25.2 pg — AB (ref 26.0–34.0)
MCHC: 30.9 g/dL (ref 30.0–36.0)
MCV: 81.4 fL (ref 78.0–100.0)
MONO ABS: 0.8 10*3/uL (ref 0.1–1.0)
Monocytes Relative: 8 % (ref 3–12)
Neutro Abs: 7.7 10*3/uL (ref 1.7–7.7)
Neutrophils Relative %: 77 % (ref 43–77)
PLATELETS: 332 10*3/uL (ref 150–400)
RBC: 3.06 MIL/uL — AB (ref 4.22–5.81)
RDW: 17.8 % — ABNORMAL HIGH (ref 11.5–15.5)
WBC: 10 10*3/uL (ref 4.0–10.5)

## 2015-07-18 LAB — BASIC METABOLIC PANEL
Anion gap: 9 (ref 5–15)
BUN: 14 mg/dL (ref 6–20)
CHLORIDE: 102 mmol/L (ref 101–111)
CO2: 22 mmol/L (ref 22–32)
CREATININE: 0.85 mg/dL (ref 0.61–1.24)
Calcium: 8.4 mg/dL — ABNORMAL LOW (ref 8.9–10.3)
GFR calc non Af Amer: >60 mL/min (ref >60)
Glucose, Bld: 139 mg/dL — ABNORMAL HIGH (ref 65–99)
Potassium: 4 mmol/L (ref 3.5–5.1)
SODIUM: 133 mmol/L — AB (ref 135–145)

## 2015-07-18 LAB — VITAMIN B12: Vitamin B-12: 696 pg/mL (ref 180–914)

## 2015-07-18 LAB — I-STAT TROPONIN, ED: Troponin i, poc: 0.02 ng/mL (ref 0.00–0.08)

## 2015-07-18 LAB — TSH: TSH: 1.351 u[IU]/mL (ref 0.350–4.500)

## 2015-07-18 LAB — FERRITIN: FERRITIN: 455 ng/mL — AB (ref 24–336)

## 2015-07-18 LAB — RETICULOCYTES
RBC.: 2.99 MIL/uL — AB (ref 4.22–5.81)
Retic Count, Absolute: 62.8 10*3/uL (ref 19.0–186.0)
Retic Ct Pct: 2.1 % (ref 0.4–3.1)

## 2015-07-18 LAB — PROTIME-INR
INR: 2.2 — ABNORMAL HIGH (ref 0.00–1.49)
Prothrombin Time: 24.2 seconds — ABNORMAL HIGH (ref 11.6–15.2)

## 2015-07-18 LAB — HEPARIN LEVEL (UNFRACTIONATED): Heparin Unfractionated: >2.2 IU/mL — ABNORMAL HIGH (ref 0.30–0.70)

## 2015-07-18 LAB — POC OCCULT BLOOD, ED: Fecal Occult Bld: NEGATIVE

## 2015-07-18 LAB — FOLATE: FOLATE: 6.7 ng/mL (ref >5.9)

## 2015-07-18 LAB — APTT: APTT: 38 s — AB (ref 24–37)

## 2015-07-18 LAB — ABO/RH: ABO/RH(D): A POS

## 2015-07-18 MED ORDER — DEXTROSE 5 % IV SOLN
500.0000 mg | Freq: Once | INTRAVENOUS | Status: AC
  Administered 2015-07-18: 500 mg via INTRAVENOUS
  Filled 2015-07-18: qty 500

## 2015-07-18 MED ORDER — PROMETHAZINE HCL 25 MG PO TABS: 12.5000 mg | ORAL_TABLET | Freq: Four times a day (QID) | ORAL | Status: DC | PRN

## 2015-07-18 MED ORDER — SODIUM CHLORIDE 0.9 % IJ SOLN
3.0000 mL | Freq: Two times a day (BID) | INTRAMUSCULAR | Status: DC
  Administered 2015-07-19 – 2015-07-21 (×4): 3 mL via INTRAVENOUS

## 2015-07-18 MED ORDER — SENNOSIDES-DOCUSATE SODIUM 8.6-50 MG PO TABS: 1.0000 | ORAL_TABLET | Freq: Every evening | ORAL | Status: DC | PRN

## 2015-07-18 MED ORDER — ACETAMINOPHEN 650 MG RE SUPP: 650.0000 mg | Freq: Four times a day (QID) | RECTAL | Status: DC | PRN

## 2015-07-18 MED ORDER — RIVAROXABAN 15 MG PO TABS
15.0000 mg | ORAL_TABLET | Freq: Once | ORAL | Status: AC
  Administered 2015-07-18: 15 mg via ORAL
  Filled 2015-07-18: qty 1

## 2015-07-18 MED ORDER — ACETAMINOPHEN 325 MG PO TABS
650.0000 mg | ORAL_TABLET | Freq: Four times a day (QID) | ORAL | Status: DC | PRN
  Administered 2015-07-19 – 2015-07-20 (×2): 650 mg via ORAL
  Filled 2015-07-18 (×2): qty 2

## 2015-07-18 MED ORDER — BISACODYL 10 MG RE SUPP: 10.0000 mg | Freq: Every day | RECTAL | Status: DC | PRN

## 2015-07-18 MED ORDER — DEXTROSE 5 % IV SOLN
1.0000 g | INTRAVENOUS | Status: DC
  Administered 2015-07-19 – 2015-07-20 (×2): 1 g via INTRAVENOUS
  Filled 2015-07-18 (×2): qty 10

## 2015-07-18 MED ORDER — IOHEXOL 300 MG/ML  SOLN
75.0000 mL | Freq: Once | INTRAMUSCULAR | Status: AC | PRN
  Administered 2015-07-18: 75 mL via INTRAVENOUS

## 2015-07-18 MED ORDER — VITAMIN B-1 100 MG PO TABS
100.0000 mg | ORAL_TABLET | Freq: Every day | ORAL | Status: DC
  Administered 2015-07-18 – 2015-07-21 (×4): 100 mg via ORAL
  Filled 2015-07-18 (×4): qty 1

## 2015-07-18 MED ORDER — DEXTROSE 5 % IV SOLN
1.0000 g | Freq: Once | INTRAVENOUS | Status: AC
  Administered 2015-07-18: 1 g via INTRAVENOUS
  Filled 2015-07-18: qty 10

## 2015-07-18 MED ORDER — ALBUTEROL SULFATE (2.5 MG/3ML) 0.083% IN NEBU: 2.5000 mg | INHALATION_SOLUTION | RESPIRATORY_TRACT | Status: DC | PRN

## 2015-07-18 MED ORDER — PANTOPRAZOLE SODIUM 40 MG PO TBEC
40.0000 mg | DELAYED_RELEASE_TABLET | Freq: Every day | ORAL | Status: DC
  Administered 2015-07-19 – 2015-07-21 (×3): 40 mg via ORAL
  Filled 2015-07-18 (×3): qty 1

## 2015-07-18 MED ORDER — DEXTROSE 5 % IV SOLN
500.0000 mg | INTRAVENOUS | Status: DC
  Administered 2015-07-19: 500 mg via INTRAVENOUS
  Filled 2015-07-18: qty 500

## 2015-07-18 MED ORDER — HEPARIN (PORCINE) IN NACL 100-0.45 UNIT/ML-% IJ SOLN
950.0000 [IU]/h | INTRAMUSCULAR | Status: DC
  Administered 2015-07-18: 950 [IU]/h via INTRAVENOUS
  Filled 2015-07-18: qty 250

## 2015-07-18 MED ORDER — ASPIRIN EC 81 MG PO TBEC
81.0000 mg | DELAYED_RELEASE_TABLET | Freq: Every day | ORAL | Status: DC
  Administered 2015-07-19 – 2015-07-21 (×3): 81 mg via ORAL
  Filled 2015-07-18 (×3): qty 1

## 2015-07-18 MED ORDER — SODIUM CHLORIDE 0.9 % IV SOLN
INTRAVENOUS | Status: AC
Start: 2015-07-18 — End: 2015-07-19
  Administered 2015-07-18: 15:00:00 via INTRAVENOUS

## 2015-07-18 MED ORDER — VITAMIN K1 10 MG/ML IJ SOLN: 10.0000 mg | Freq: Once | INTRAVENOUS | Status: DC

## 2015-07-18 NOTE — Progress Notes (Signed)
Images were reviewed and discussed with Dr. Algis Liming.  Would recommend completing the imaging workup with completion contrast enhanced CT of the abdomen and pelvis to evaluate for additional biopsy sites.   If no more favorable biopsy sites are seen within the abdomen or pelvis, would recommend proceeding with ultrasound-guided supra-clavicular lymph node biopsy, however would need correction of the patient's INR (preferably below 1.5) prior to proceeding with any biopsy procedure.  In the meantime, would recommend no longer giving Xarelto and converting the patient to a heparin drip and anticipation of proceeding with biopsy.  Would also recommend making the patient NPO after midnight to allow the opportunity for perform a biopsy tomorrow.  Ronny Bacon, MD Pager #: [phone]

## 2015-07-18 NOTE — Telephone Encounter (Signed)
Received faxed form from insurance and while filling it out I found that the hospital had discontinued the medication. - CF

## 2015-07-18 NOTE — ED Notes (Signed)
Pt can go at 13:45

## 2015-07-18 NOTE — Progress Notes (Signed)
Pharmacy - Brief Note  Orders received for Vitamin K '10mg'$  IVPB x 1 this evening, patient given rivaroxaban '15mg'$  PO x 1 in ED at 11:30am for acute PE (but changing to heparin gtt). Baseline coags checked prior to initiation of heparin to assist pharmacy with dosing/monitoring (labs drawn at ~2pm):  PT/INR: 24.2/2.20 Heparin anti-Xa >2.2   Plan:  Discussed with Dr. Lavella Lemons that INR elevation most likely due to effects of rivaroxaban given 2.5h prior to lab draw.  Vitamin K order was discontinued and plan to recheck INR in am (INR should be decreased and approaching normal levels)  Doreene Eland, PharmD, BCPS.   Pager: [phone] 07/18/2015 4:49 PM

## 2015-07-18 NOTE — Progress Notes (Signed)
Per Dr. Pascal Lux to keep heparin gtt infusing until patient is called for biopsy. Will continue to monitor patient closely.

## 2015-07-18 NOTE — Progress Notes (Signed)
Addendum  Discussed with interventional radiologist Dr. Pascal Lux INR elevated at 2.2. Unclear etiology.? Nutritional versus liver metastases. As per Dr. Pascal Lux, patient has supraclavicular lymph nodes which can be biopsied. He recommended obtaining CT abdomen with contrast, correcting coagulopathy with vitamin K and follow up in a.m. for possible supraclavicular lymph node biopsy. Nothing by mouth after midnight for possible lymph node biopsy in a.m.  Vernell Leep, MD, FACP, FHM. [facility] Pager [phone]  If 7PM-7AM, please contact night-coverage [email or web address] Password [facility] 07/18/2015, 4:32 PM

## 2015-07-18 NOTE — Telephone Encounter (Signed)
I was called about the CT scan results. Unfortunately this is not a surprise. However he has 2 associated diagnoses that require immediate treatment including probable pneumonitis/pneumonia associated with the cancer as well as a pulmonary embolism. CMA Blanche East called family expressed understanding and agreement. Recommend patient go directly to [facility] emergency room for evaluation and management of his acute issues as well as starting workup and treatment for lung cancer. I feel this cannot wait for outpatient evaluation and management because he has a pulmonary embolism as well as probable pneumonia.

## 2015-07-18 NOTE — ED Provider Notes (Signed)
CSN: [id number]     Arrival date & time 07/18/15  1044 History   First MD Initiated Contact with Patient 07/18/15 1104     Chief Complaint  Patient presents with  . Lung Cancer  . Pneumonia     (Consider location/radiation/quality/duration/timing/severity/associated sxs/prior Treatment) HPI Comments: Patient who is Spanish-speaking presents as a referral from his PCP. History is obtained through his son who interprets. He went to a PCP for a new patient visit a few days ago. He has noticed a 33 5 pound weight loss over the last 2 months he's had some shortness of breath and some intermittent pains in his chest. He's had a lot of generalized weakness and fatigue. He does have a history of hypertension hyperlipidemia, past alcohol abuse and cerebellar stroke. He had a CT scan done today which was ordered by his PCP showing a right lung mass with associated pneumonia and pulmonary embolus.   Past Medical History  Diagnosis Date  . Depression   . Hx of migraines   . Alcohol abuse     In past  . GERD (gastroesophageal reflux disease)   . SOB (shortness of breath)     sometimes at night  . PUD (peptic ulcer disease)     with bleeding ulcers and bowel obstruction in 1994 and 1995  . DDD (degenerative disc disease), cervical   . DDD (degenerative disc disease), lumbosacral   . Hyperlipidemia   . Stroke     cerebellar CVA  . Shingles   . Asthmatic bronchitis   . Hypertension    History reviewed. No pertinent past surgical history. Family History  Problem Relation Age of Onset  . Heart disease Father   . Heart failure Father   . Lung cancer Sister    History  Substance Use Topics  . Smoking status: Current Every Day Smoker -- 0.25 packs/day    Types: Cigarettes  . Smokeless tobacco: Never Used     Comment: Pt smokes 5-6 cigarettes a day  . Alcohol Use: No    Review of Systems  Constitutional: Positive for appetite change, fatigue and unexpected weight change. Negative for  fever, chills and diaphoresis.  HENT: Negative for congestion, rhinorrhea and sneezing.   Eyes: Negative.   Respiratory: Positive for cough and shortness of breath. Negative for chest tightness.   Cardiovascular: Positive for chest pain. Negative for leg swelling.  Gastrointestinal: Negative for nausea, vomiting, abdominal pain, diarrhea and blood in stool.  Genitourinary: Negative for frequency, hematuria, flank pain and difficulty urinating.  Musculoskeletal: Negative for back pain and arthralgias.  Skin: Negative for rash.  Neurological: Negative for dizziness, speech difficulty, weakness, numbness and headaches.      Allergies  Review of patient's allergies indicates no known allergies.  Home Medications   Prior to Admission medications   Medication Sig Start Date End Date Taking? Authorizing Provider  aspirin EC 81 MG tablet Take 81 mg by mouth daily.   Yes Historical Provider, MD  Cyanocobalamin (VITAMIN B-12 PO) Take 1 tablet by mouth daily.   Yes Historical Provider, MD  omeprazole (PRILOSEC) 40 MG capsule Take 1 capsule (40 mg total) by mouth daily. 07/13/15  Yes Gregor Hams, MD  megestrol (MEGACE) 400 MG/10ML suspension Take 10 mLs (400 mg total) by mouth daily. Patient not taking: Reported on 07/18/2015 07/13/15   Gregor Hams, MD   BP 142/66 mmHg  Pulse 87  Temp(Src) 98.1 F (36.7 C) (Oral)  Resp 18  Ht 6' (1.829 m)  Wt 123 lb (55.792 kg)  BMI 16.68 kg/m2  SpO2 99% Physical Exam  Constitutional: He is oriented to person, place, and time. He appears well-developed and well-nourished.  HENT:  Head: Normocephalic and atraumatic.  Eyes: Pupils are equal, round, and reactive to light.  Neck: Normal range of motion. Neck supple.  Cardiovascular: Normal rate, regular rhythm and normal heart sounds.   Pulmonary/Chest: Effort normal and breath sounds normal. No respiratory distress. He has no wheezes. He has no rales. He exhibits no tenderness.  Abdominal: Soft. Bowel  sounds are normal. There is no tenderness. There is no rebound and no guarding.  Musculoskeletal: Normal range of motion. He exhibits no edema.  Lymphadenopathy:    He has no cervical adenopathy.  Neurological: He is alert and oriented to person, place, and time.  Skin: Skin is warm and dry. No rash noted.  Psychiatric: He has a normal mood and affect.    ED Course  Procedures (including critical care time) Labs Review Labs Reviewed  BASIC METABOLIC PANEL - Abnormal; Notable for the following:    Sodium 133 (*)    Glucose, Bld 139 (*)    Calcium 8.4 (*)    All other components within normal limits  CBC WITH DIFFERENTIAL/PLATELET - Abnormal; Notable for the following:    RBC 3.06 (*)    Hemoglobin 7.7 (*)    HCT 24.9 (*)    MCH 25.2 (*)    RDW 17.8 (*)    All other components within normal limits  I-STAT TROPOININ, ED  POC OCCULT BLOOD, ED  TYPE AND SCREEN    Imaging Review Ct Chest W Contrast  07/18/2015   ADDENDUM REPORT: 07/18/2015 10:21  ADDENDUM: Critical Value/emergent results were called by telephone at the time of interpretation on 07/18/2015 at 10:21 am to Dr. Lynne Leader , who verbally acknowledged these results.   Electronically Signed   By: Franchot Gallo M.D.   On: 07/18/2015 10:21   07/18/2015   CLINICAL DATA:  Lung nodule.  Weight loss.  EXAM: CT CHEST WITH CONTRAST  TECHNIQUE: Multidetector CT imaging of the chest was performed during intravenous contrast administration.  CONTRAST:  66m OMNIPAQUE IOHEXOL 300 MG/ML  SOLN  COMPARISON:  Chest x-ray 07/13/2015  FINDINGS: Right upper lobe nodule measures 17 x 20 mm. Soft tissue density without calcification.  Mediastinal adenopathy. Right peritracheal lymph node measures 27 mm. Left lower peritracheal lymph node 15 mm. Right upper paratracheal lymph nodes measuring approximately 15 mm. Enlarged lymph nodes in the lower neck bilaterally. These lymph nodes are consistent with metastatic disease. Findings are most consistent with  carcinoma of the lung. Subcentimeter lymph nodes right hilum, possible tumor involvement.  Filling defect right lower lobe pulmonary artery in the medial base with branching in the smaller vessels, consistent with pulmonary embolism. No other filling defects identified.  Patchy airspace disease in the right posterior lung base and in the right posterior lung apex. Small right effusion. These findings are most likely related to pneumonia with tumor considered less likely. Left lung is clear.  Apical scarring bilaterally.  No skeletal lesions.  Moderate pericardial effusion. Malignancy is possible in the pericardium. Coronary artery calcification.  Upper abdomen reveals no mass lesion.  IMPRESSION: 17 x 20 mm right upper lobe nodule. There is malignant appearing adenopathy in the mediastinum and lower neck, consistent with metastatic disease. 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  Pericardial effusion, possibly due to metastatic disease.  Right upper lobe infiltrate and right lower lobe infiltrate, suspicious for  pneumonia. Small right effusion.  Right lower lobe isolated pulmonary embolism.  Electronically Signed: By: Franchot Gallo M.D. On: 07/18/2015 10:15     EKG Interpretation   Date/Time:  Tuesday July 18 2015 11:05:15 EDT Ventricular Rate:  91 PR Interval:  169 QRS Duration: 136 QT Interval:  422 QTC Calculation: 519 R Axis:   -63 Text Interpretation:  Sinus rhythm Left bundle branch block No old tracing  to compare Confirmed by [place]  MD, Badger ([id number]) on 07/18/2015 11:11:48 AM      MDM   Final diagnoses:  CAP (community acquired pneumonia)  PE (pulmonary embolism)  Lung mass    Patient presents with CT findings of pneumonia, PE and lung mass. He is hemodynamically stable. His oxygen saturations are normal. He is alert and oriented and is not in distress. After my initial evaluation I started him on antibiotics for community-acquired pneumonia as well as Xarelto for the PE. In retrospect,  heparin would've been a better choice given that he needs a lung biopsy and his hemoglobin is low. Unfortunately the patient had already been given the dose of Xarelto.  He will be admitted to the hospital service for further evaluation.    Malvin Johns, MD 07/18/15 [phone]

## 2015-07-18 NOTE — Progress Notes (Signed)
PATIENT NEEDS Patent attorney PATIENT FROM THE [place]

## 2015-07-18 NOTE — H&P (Signed)
History and Physical  Jared Rogers WPY:[id number] DOB: 02-Sep-1941 DOA: 07/18/2015  Referring physician: Dr. Lauretta Chester, EDP PCP: Scarlette Calico, MD  Outpatient Specialists:  1. Cardiology: Dr. Fransico Him  Chief Complaint: Difficulty breathing  HPI: Jared Rogers is a 74 y.o. male Spanish-speaking male patient, married but spouse lives in [place] currently residing with his brother-in-law for the last year or so, has not seen PCP since June 2015, PMH of HTN, LBBB, GERD, tobacco abuse, HLD, CVA, PUD with bleeding ulcers and bowel obstruction in 1994-95, possible mild dementia, medical noncompliance, recently seen at [facility] on 07/13/15 to establish care and complained of profound weight loss, decreased appetite and was sent for CTA chest which was done today and showed RUL lung mass suspicious for lung cancer, pneumonia and RLL isolated pulmonary embolism. M.D. ordering CTA chest advised patient to come to [facility] ED. History was obtained via a Medical illustrator Chartered loss adjuster who speaks fluent Spanish). Patient gives several months history of dyspnea which over the last week or 2 has progressively gotten worse. This is usually worse with exertion and occasionally has it at rest. Associated with cough and intermittent white sputum. Has chills without fevers. Mid anterior upper and lower substernal chest pain only on coughing. Decreased appetite. Has lost approximately 30 pounds over the last 4-6 weeks. No nausea, vomiting, diarrhea, rectal bleeding or black stools. Occasionally has dizziness in upright position. 3 days history of left leg swelling and longer history of left leg pain. CT results as indicated below. Lab work significant for hemoglobin of 7.7. Of note, patient had seen cardiology in August 2015 and had 2-D echo with normal LV function and stress test without ischemia. Patient has received a dose of Xarelto in the ED. Hospitalist  admission was requested.   Review of Systems: All systems reviewed and apart from history of presenting illness, are negative.  Past Medical History  Diagnosis Date  . Depression   . Hx of migraines   . Alcohol abuse     In past  . GERD (gastroesophageal reflux disease)   . SOB (shortness of breath)     sometimes at night  . PUD (peptic ulcer disease)     with bleeding ulcers and bowel obstruction in 1994 and 1995  . DDD (degenerative disc disease), cervical   . DDD (degenerative disc disease), lumbosacral   . Hyperlipidemia   . Stroke     cerebellar CVA  . Shingles   . Asthmatic bronchitis   . Hypertension    History reviewed. No pertinent past surgical history. Social History:  reports that he has been smoking Cigarettes.  He has been smoking about 0.25 packs per day. He has never used smokeless tobacco. He reports that he does not drink alcohol or use illicit drugs. Married. Independent of activities of daily living.  No Known Allergies  Family History  Problem Relation Age of Onset  . Heart disease Father   . Heart failure Father   . Lung cancer Sister     Prior to Admission medications   Medication Sig Start Date End Date Taking? Authorizing Provider  aspirin EC 81 MG tablet Take 81 mg by mouth daily.   Yes Historical Provider, MD  Cyanocobalamin (VITAMIN B-12 PO) Take 1 tablet by mouth daily.   Yes Historical Provider, MD  omeprazole (PRILOSEC) 40 MG capsule Take 1 capsule (40 mg total) by mouth daily. 07/13/15  Yes Gregor Hams, MD  Physical Exam: Filed Vitals:   07/18/15 1101 07/18/15 1135 07/18/15 1200 07/18/15 1314  BP:  124/84 142/66 136/76  Pulse: 94 88 87 91  Temp: 98.1 F (36.7 C)     TempSrc: Oral     Resp: '18 25 18 17  '$ Height: 6' (1.829 m)     Weight: 55.792 kg (123 lb)     SpO2: 99% 100% 99% 98%     General exam: Moderately built and nourished pleasant elderly male patient, lying comfortably supine on the gurney in no obvious  distress.  Head, eyes and ENT: Nontraumatic and normocephalic. Pupils equally reacting to light and accommodation. Oral mucosa slightly dry. Bilateral near mature cataracts.  Neck: Supple. No JVD, carotid bruit or thyromegaly.  Lymphatics: No lymphadenopathy.  Respiratory system: Clear to auscultation. No increased work of breathing.  Cardiovascular system: S1 and S2 heard, RRR. No JVD, murmurs, gallops, clicks or pedal edema.  Gastrointestinal system: Abdomen is nondistended, soft and nontender. Normal bowel sounds heard. No organomegaly or masses appreciated.  Central nervous system: Alert and oriented. No focal neurological deficits.  Extremities: Symmetric 5 x 5 power. Peripheral pulses symmetrically felt. Left leg seems slightly asymmetrically swollen compared to right but no other acute findings.  Skin: No rashes or acute findings.  Musculoskeletal system: Negative exam.  Psychiatry: Pleasant and cooperative.   Labs on Admission:  Basic Metabolic Panel:  Recent Labs Lab 07/13/15 1013 07/18/15 1133  NA 138 133*  K 3.8 4.0  CL 101 102  CO2 21 22  GLUCOSE 92 139*  BUN 13 14  CREATININE 0.73 0.85  CALCIUM 8.6 8.4*   Liver Function Tests:  Recent Labs Lab 07/13/15 1013  AST 23  ALT 17  ALKPHOS 88  BILITOT 0.6  PROT 8.0  ALBUMIN 2.6*   No results for input(s): LIPASE, AMYLASE in the last 168 hours. No results for input(s): AMMONIA in the last 168 hours. CBC:  Recent Labs Lab 07/13/15 1013 07/18/15 1133  WBC 10.5 10.0  NEUTROABS 7.4 7.7  HGB 7.8* 7.7*  HCT 25.5* 24.9*  MCV 82.5 81.4  PLT 344 332   Cardiac Enzymes: No results for input(s): CKTOTAL, CKMB, CKMBINDEX, TROPONINI in the last 168 hours.  BNP (last 3 results) No results for input(s): PROBNP in the last 8760 hours. CBG: No results for input(s): GLUCAP in the last 168 hours.  Radiological Exams on Admission: Ct Chest W Contrast  07/18/2015   ADDENDUM REPORT: 07/18/2015 10:21   ADDENDUM: Critical Value/emergent results were called by telephone at the time of interpretation on 07/18/2015 at 10:21 am to Dr. Lynne Leader , who verbally acknowledged these results.   Electronically Signed   By: Franchot Gallo M.D.   On: 07/18/2015 10:21   07/18/2015   CLINICAL DATA:  Lung nodule.  Weight loss.  EXAM: CT CHEST WITH CONTRAST  TECHNIQUE: Multidetector CT imaging of the chest was performed during intravenous contrast administration.  CONTRAST:  59m OMNIPAQUE IOHEXOL 300 MG/ML  SOLN  COMPARISON:  Chest x-ray 07/13/2015  FINDINGS: Right upper lobe nodule measures 17 x 20 mm. Soft tissue density without calcification.  Mediastinal adenopathy. Right peritracheal lymph node measures 27 mm. Left lower peritracheal lymph node 15 mm. Right upper paratracheal lymph nodes measuring approximately 15 mm. Enlarged lymph nodes in the lower neck bilaterally. These lymph nodes are consistent with metastatic disease. Findings are most consistent with carcinoma of the lung. Subcentimeter lymph nodes right hilum, possible tumor involvement.  Filling defect right lower lobe pulmonary  artery in the medial base with branching in the smaller vessels, consistent with pulmonary embolism. No other filling defects identified.  Patchy airspace disease in the right posterior lung base and in the right posterior lung apex. Small right effusion. These findings are most likely related to pneumonia with tumor considered less likely. Left lung is clear.  Apical scarring bilaterally.  No skeletal lesions.  Moderate pericardial effusion. Malignancy is possible in the pericardium. Coronary artery calcification.  Upper abdomen reveals no mass lesion.  IMPRESSION: 17 x 20 mm right upper lobe nodule. There is malignant appearing adenopathy in the mediastinum and lower neck, consistent with metastatic disease. 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  Pericardial effusion, possibly due to metastatic disease.  Right upper lobe infiltrate and right lower lobe  infiltrate, suspicious for pneumonia. Small right effusion.  Right lower lobe isolated pulmonary embolism.  Electronically Signed: By: Franchot Gallo M.D. On: 07/18/2015 10:15    EKG: Independently reviewed. Sinus rhythm with LBBB (old).  Assessment/Plan Principal Problem:   Lung mass Active Problems:   Hypertension   LBBB (left bundle branch block)   Anemia   GERD (gastroesophageal reflux disease)   Dementia with behavioral disturbance   Unintentional weight loss   CAP (community acquired pneumonia)   Pulmonary embolism   Tobacco abuse   Right upper lobe lung mass - Suspicious for malignancy. - Consulted interventional radiology for CT-guided biopsy. - Patient has received a dose of Xarelto on 07/18/15 at 11:34 AM. As discussed with pharmacy, will transition to IV heparin later this evening. Heparin drip can be temporarily held on 07/19/15 for biopsy. Post biopsy, may consider starting anticoagulants: LMWH Vs ? NOAC's. - After malignancy confirmed, will need further evaluation i.e. CTA abdomen to look for extent of disease and oncology consultation.   Right lower lobe isolated pulmonary embolism/? Left lower extremity DVT - We will check bilateral lower extremity venous Dopplers - Patient has received a dose of Xarelto on 07/18/15 at 11:34 AM. As discussed with pharmacy, will transition to IV heparin later this evening. Heparin drip can be temporarily held on 07/19/15 for biopsy. Post biopsy, may consider starting anticoagulants: LMWH Vs ? NOAC's. - Remote history of PUD with GI bleeding but none recently. Monitor closely while on anticoagulation.  Community acquired pneumonia-RUL and RLL - Continue IV Rocephin and azithromycin  Moderate pericardial effusion - Reported on CTA chest. No clinical features suggestive of tamponade - will get 2-D echo to further evaluate.  Normocytic anemia, chronic - No reported bleeding. FOBT negative. - Likely secondary to malignancy and chronic  disease. - Check anemia panel. - Transfuse for hemoglobin <7 g per DL.  Essential hypertension - Controlled off medications. Monitor.  GERD - PPI  Unintentional profound weight loss - Likely secondary to malignancy. Nutrition consultation.  History of CVA - On aspirin  Chronic LBBB  Mild dementia  Tobacco abuse      DVT prophylaxis: On full dose IV heparin Code Status: Full-confirmed via interpreter  Family Communication: None at bedside  Disposition Plan: DC home when medically stable, possibly in the next 3-4 days.   Time spent: 47 minutes  Sahara Fujimoto, MD, FACP, FHM. [facility] Pager [phone]  If 7PM-7AM, please contact night-coverage [email or web address] Password [email or web address] 07/18/2015, 1:56 PM

## 2015-07-18 NOTE — ED Notes (Signed)
Emergency contact Amy Kirk Ruths (daughter n law) [phone] cell [phone] work ext. [place]

## 2015-07-18 NOTE — Progress Notes (Signed)
Echocardiogram 2D Echocardiogram has been performed.  Tresa Res 07/18/2015, 4:26 PM

## 2015-07-18 NOTE — Progress Notes (Signed)
ANTICOAGULATION CONSULT NOTE - Initial Consult  Pharmacy Consult for Heparin Indication: pulmonary embolus  No Known Allergies  Patient Measurements: Height: 6' (182.9 cm) Weight: 123 lb (55.792 kg) IBW/kg (Calculated) : 77.6 Heparin Dosing Weight: 56kg  Vital Signs: Temp: 98.1 F (36.7 C) (08/02 1101) Temp Source: Oral (08/02 1101) BP: 136/76 mmHg (08/02 1314) Pulse Rate: 91 (08/02 1314)  Labs:  Recent Labs  07/18/15 1133  HGB 7.7*  HCT 24.9*  PLT 332  CREATININE 0.85    Estimated Creatinine Clearance: 61.1 mL/min (by C-G formula based on Cr of 0.85).   Medical History: Past Medical History  Diagnosis Date  . Depression   . Hx of migraines   . Alcohol abuse     In past  . GERD (gastroesophageal reflux disease)   . SOB (shortness of breath)     sometimes at night  . PUD (peptic ulcer disease)     with bleeding ulcers and bowel obstruction in 1994 and 1995  . DDD (degenerative disc disease), cervical   . DDD (degenerative disc disease), lumbosacral   . Hyperlipidemia   . Stroke     cerebellar CVA  . Shingles   . Asthmatic bronchitis   . Hypertension     Medications:   (Not in a hospital admission) Scheduled:  Infusions:   Assessment: 74yo M was referred to [facility] ER after an outpatient CT revealed pneumonia, chest mass, and PE. He received Xarelto '15mg'$  x 1 at 1134. Pharmacy is asked to start a heparin infusion for anticoagulation in anticipation of lung mass biopsy on 8/3. Xarelto will influence the heparin level so we will be using a combination of aPTT and heparin levels to make dosing decisions for at least the first 24hrs.  No baseline coag labs have been drawn.  H/H is low, transfusion has been ordered.  Platelets are wnl.  Hx PUD on PPI at home.  Hx CVA on ASA at home.     Goal of Therapy:  Heparin level 0.3-0.7 units/ml aPTT 66-102 seconds Monitor platelets by anticoagulation protocol: Yes   Plan:   Draw baseline aPTT,  heparin level, and PT/INR now.  Start heparin with no bolus, 10 hours after the Xarelto dose was given. Start infusion at 950units/hr.  Check heparin level in 6hrs and daily thereafter.  Check CBC q24h while on heparin.  F/u daily.  MD to specify when heparin should be held prior to biopsy once it's scheduled.  Romeo Rabon, PharmD, pager [phone]. 07/18/2015,1:49 PM.

## 2015-07-18 NOTE — Progress Notes (Signed)
Spoke with Dr. Algis Liming at [phone], CT A/p with cm will be done in am. Patient had a CT Chest w/ IV cm today.

## 2015-07-18 NOTE — ED Notes (Addendum)
Per family pt referred here post CT diagnosing pneumonia and PE; hx of lung cancer.

## 2015-07-19 ENCOUNTER — Inpatient Hospital Stay (HOSPITAL_COMMUNITY): Payer: PPO

## 2015-07-19 ENCOUNTER — Encounter (HOSPITAL_COMMUNITY): Payer: Self-pay | Admitting: Radiology

## 2015-07-19 DIAGNOSIS — I447 Left bundle-branch block, unspecified: Secondary | ICD-10-CM

## 2015-07-19 DIAGNOSIS — E43 Unspecified severe protein-calorie malnutrition: Secondary | ICD-10-CM

## 2015-07-19 DIAGNOSIS — R591 Generalized enlarged lymph nodes: Secondary | ICD-10-CM | POA: Diagnosis present

## 2015-07-19 DIAGNOSIS — R52 Pain, unspecified: Secondary | ICD-10-CM

## 2015-07-19 DIAGNOSIS — I2699 Other pulmonary embolism without acute cor pulmonale: Secondary | ICD-10-CM

## 2015-07-19 DIAGNOSIS — I5042 Chronic combined systolic (congestive) and diastolic (congestive) heart failure: Secondary | ICD-10-CM

## 2015-07-19 DIAGNOSIS — R634 Abnormal weight loss: Secondary | ICD-10-CM

## 2015-07-19 DIAGNOSIS — R918 Other nonspecific abnormal finding of lung field: Secondary | ICD-10-CM

## 2015-07-19 DIAGNOSIS — Z720 Tobacco use: Secondary | ICD-10-CM

## 2015-07-19 LAB — CBC
HEMATOCRIT: 20.3 % — AB (ref 39.0–52.0)
HEMOGLOBIN: 6.5 g/dL — AB (ref 13.0–17.0)
MCH: 25.8 pg — ABNORMAL LOW (ref 26.0–34.0)
MCHC: 32 g/dL (ref 30.0–36.0)
MCV: 80.6 fL (ref 78.0–100.0)
Platelets: 324 10*3/uL (ref 150–400)
RBC: 2.52 MIL/uL — AB (ref 4.22–5.81)
RDW: 17.6 % — ABNORMAL HIGH (ref 11.5–15.5)
WBC: 9.2 10*3/uL (ref 4.0–10.5)

## 2015-07-19 LAB — COMPREHENSIVE METABOLIC PANEL
ALT: 14 U/L — AB (ref 17–63)
AST: 18 U/L (ref 15–41)
Albumin: 1.7 g/dL — ABNORMAL LOW (ref 3.5–5.0)
Alkaline Phosphatase: 64 U/L (ref 38–126)
Anion gap: 5 (ref 5–15)
BUN: 13 mg/dL (ref 6–20)
CO2: 25 mmol/L (ref 22–32)
CREATININE: 0.75 mg/dL (ref 0.61–1.24)
Calcium: 7.9 mg/dL — ABNORMAL LOW (ref 8.9–10.3)
Chloride: 104 mmol/L (ref 101–111)
GFR calc non Af Amer: >60 mL/min (ref >60)
Glucose, Bld: 99 mg/dL (ref 65–99)
Potassium: 3.7 mmol/L (ref 3.5–5.1)
Sodium: 134 mmol/L — ABNORMAL LOW (ref 135–145)
TOTAL PROTEIN: 6.7 g/dL (ref 6.5–8.1)
Total Bilirubin: 0.7 mg/dL (ref 0.3–1.2)

## 2015-07-19 LAB — HEMOGLOBIN A1C
HEMOGLOBIN A1C: 5.1 % (ref 4.8–5.6)
Mean Plasma Glucose: 100 mg/dL

## 2015-07-19 LAB — HEPARIN LEVEL (UNFRACTIONATED)
Heparin Unfractionated: 1.72 IU/mL — ABNORMAL HIGH (ref 0.30–0.70)
Heparin Unfractionated: 0.54 IU/mL (ref 0.30–0.70)

## 2015-07-19 LAB — APTT
APTT: 39 s — AB (ref 24–37)
aPTT: 45 seconds — ABNORMAL HIGH (ref 24–37)

## 2015-07-19 LAB — PROTIME-INR
INR: 1.78 — ABNORMAL HIGH (ref 0.00–1.49)
Prothrombin Time: 20.6 seconds — ABNORMAL HIGH (ref 11.6–15.2)

## 2015-07-19 LAB — PREPARE RBC (CROSSMATCH)

## 2015-07-19 MED ORDER — IOHEXOL 300 MG/ML  SOLN
25.0000 mL | INTRAMUSCULAR | Status: AC
  Administered 2015-07-19 (×2): 25 mL via ORAL

## 2015-07-19 MED ORDER — LISINOPRIL 10 MG PO TABS
10.0000 mg | ORAL_TABLET | Freq: Every day | ORAL | Status: DC
  Administered 2015-07-19 – 2015-07-21 (×3): 10 mg via ORAL
  Filled 2015-07-19 (×3): qty 1

## 2015-07-19 MED ORDER — CARVEDILOL 3.125 MG PO TABS
3.1250 mg | ORAL_TABLET | Freq: Two times a day (BID) | ORAL | Status: DC
  Administered 2015-07-19 – 2015-07-21 (×4): 3.125 mg via ORAL
  Filled 2015-07-19 (×4): qty 1

## 2015-07-19 MED ORDER — FENTANYL CITRATE (PF) 100 MCG/2ML IJ SOLN
INTRAMUSCULAR | Status: AC
  Filled 2015-07-19: qty 4

## 2015-07-19 MED ORDER — BOOST / RESOURCE BREEZE PO LIQD
1.0000 | Freq: Two times a day (BID) | ORAL | Status: DC
  Administered 2015-07-19 – 2015-07-21 (×5): 1 via ORAL

## 2015-07-19 MED ORDER — IOHEXOL 300 MG/ML  SOLN
100.0000 mL | Freq: Once | INTRAMUSCULAR | Status: AC | PRN
  Administered 2015-07-19: 100 mL via INTRAVENOUS

## 2015-07-19 MED ORDER — METOPROLOL TARTRATE 25 MG PO TABS: 12.5000 mg | ORAL_TABLET | Freq: Two times a day (BID) | ORAL | Status: DC

## 2015-07-19 MED ORDER — PHYTONADIONE 5 MG PO TABS
5.0000 mg | ORAL_TABLET | Freq: Once | ORAL | Status: AC
Start: 2015-07-19 — End: 2015-07-19
  Administered 2015-07-19: 5 mg via ORAL
  Filled 2015-07-19: qty 1

## 2015-07-19 MED ORDER — LISINOPRIL 10 MG PO TABS: 10.0000 mg | ORAL_TABLET | Freq: Every day | ORAL | Status: DC

## 2015-07-19 MED ORDER — SODIUM CHLORIDE 0.9 % IV SOLN
Freq: Once | INTRAVENOUS | Status: AC
  Administered 2015-07-19: 09:00:00 via INTRAVENOUS

## 2015-07-19 MED ORDER — MIDAZOLAM HCL 2 MG/2ML IJ SOLN
INTRAMUSCULAR | Status: AC
  Administered 2015-07-19: 1 mg via INTRAVENOUS
  Filled 2015-07-19: qty 6

## 2015-07-19 MED ORDER — MIDAZOLAM HCL 2 MG/2ML IJ SOLN
1.0000 mg | Freq: Once | INTRAMUSCULAR | Status: AC
  Administered 2015-07-19: 1 mg via INTRAVENOUS

## 2015-07-19 MED ORDER — SODIUM CHLORIDE 0.9 % IV SOLN
Freq: Once | INTRAVENOUS | Status: AC
  Administered 2015-07-19: 06:00:00 via INTRAVENOUS

## 2015-07-19 MED ORDER — HEPARIN (PORCINE) IN NACL 100-0.45 UNIT/ML-% IJ SOLN
1100.0000 [IU]/h | INTRAMUSCULAR | Status: DC
  Administered 2015-07-19: 1100 [IU]/h via INTRAVENOUS

## 2015-07-19 NOTE — Progress Notes (Signed)
Biopsy site dressing with small old stain, no active bleeding or swelling noted,   patient denies pain/distress. Will continue to assess patient.

## 2015-07-19 NOTE — Progress Notes (Signed)
ANTICOAGULATION CONSULT NOTE - Follow Up Consult  Pharmacy Consult for heparin  Indication: new pulmonary embolus and DVT  No Known Allergies  Patient Measurements: Height: 6' (182.9 cm) Weight: 129 lb 9.6 oz (58.786 kg) IBW/kg (Calculated) : 77.6 Heparin Dosing Weight: 59 kg  Vital Signs: Temp: 98.6 F (37 C) (08/03 1323) Temp Source: Oral (08/03 1323) BP: 136/73 mmHg (08/03 1323) Pulse Rate: 87 (08/03 1323)  Labs:  Recent Labs  07/18/15 1133 07/18/15 1354 07/18/15 1356 07/19/15 0310  HGB 7.7*  --   --  6.5*  HCT 24.9*  --   --  20.3*  PLT 332  --   --  324  APTT  --   --  38* 45*  LABPROT  --   --  24.2* 20.6*  INR  --   --  2.20* 1.78*  HEPARINUNFRC  --  >2.20*  --  1.72*  CREATININE 0.85  --   --  0.75    Estimated Creatinine Clearance: 68.4 mL/min (by C-G formula based on Cr of 0.75).   Assessment: 74yo M was referred to [facility] ER on 8/2 after an outpatient CT revealed pneumonia, chest mass, and PE. He received Xarelto '15mg'$  x 1 at 1134 on 8/2 and then anticoagulation changed to heparin. Of note, LE doppler today is positive for new DVT.  S/p lymph node biopsy on 8/3 with heparin drip turned off at 0900 this morning prior to procedure.  Per Dr. Laurence Ferrari, to resume heparin drip 2 hours after biopsy procedure.  Xarelto will influence the heparin level so we will be using a combination of aPTT and heparin levels to make dosing decisions for at least the first 24hrs.  Once heparin level correlate with aPTT, will then transition to heparin level monitoring only.  Goal of Therapy:  Heparin level 0.3-0.7 units/ml aPTT 66-102 seconds Monitor platelets by anticoagulation protocol: Yes   Plan:  - resume heparin drip at 1100 units/hr (no bolus) at 3PM today - check 8 hour aPTT and heparin level  Lachina Salsberry P 07/19/2015,1:39 PM

## 2015-07-19 NOTE — Progress Notes (Signed)
CRITICAL VALUE ALERT  Critical value received:  hgb 6.5  Date of notification:  07/19/15  Time of notification:  0357  Critical value read back:Yes.    Nurse who received alert:  Virgina Norfolk  MD notified (1st page):  Schorr  Time of first page:  [phone]  MD notified (2nd page):  Time of second page:  Responding MD:  Schorr  Time MD responded:  [phone]

## 2015-07-19 NOTE — Consult Note (Signed)
Chief Complaint: Patient was seen in consultation today for US guided supraclavicular lymph node biopsy Chief Complaint  Patient presents with  . Lung Cancer  . Pneumonia     Referring Physician(s): TRH  History of Present Illness: Jared Rogers is a 74 y.o. male with PMH of hypertension, left bundle branch block, tobacco abuse, GERD, HLD, CVA, peptic ulcer disease with remote bleed '94-95 . He presented to the hospital 8/2 with history of weight loss, anorexia, dyspnea, cough and occasional chills. Subsequent imaging revealed a right lung nodule with mediastinal and lower neck adenopathy, pericardial effusion, right upper lobe and right lower lobe infiltrate suspicious for pneumonia, right pleural effusion, right lower lobe PE, left femoral and iliac vein DVT, and occlusion of the right femoral artery. Request has now been made for ultrasound-guided biopsy of a supraclavicular lymph node.  Past Medical History  Diagnosis Date  . Depression   . Hx of migraines   . Alcohol abuse     In past  . GERD (gastroesophageal reflux disease)   . SOB (shortness of breath)     sometimes at night  . PUD (peptic ulcer disease)     with bleeding ulcers and bowel obstruction in 1994 and 1995  . DDD (degenerative disc disease), cervical   . DDD (degenerative disc disease), lumbosacral   . Hyperlipidemia   . Stroke     cerebellar CVA  . Shingles   . Asthmatic bronchitis   . Hypertension     History reviewed. No pertinent past surgical history.  Allergies: Review of patient's allergies indicates no known allergies.  Medications: Prior to Admission medications   Medication Sig Start Date End Date Taking? Authorizing Provider  aspirin EC 81 MG tablet Take 81 mg by mouth daily.   Yes Historical Provider, MD  Cyanocobalamin (VITAMIN B-12 PO) Take 1 tablet by mouth daily.   Yes Historical Provider, MD  omeprazole (PRILOSEC) 40 MG capsule Take 1 capsule (40 mg total) by mouth daily.  07/13/15  Yes Gregor Hams, MD     Family History  Problem Relation Age of Onset  . Heart disease Father   . Heart failure Father   . Lung cancer Sister     History   Social History  . Marital Status: Single    Spouse Name: N/A  . Number of Children: N/A  . Years of Education: N/A   Social History Main Topics  . Smoking status: Current Every Day Smoker -- 0.25 packs/day    Types: Cigarettes  . Smokeless tobacco: Never Used     Comment: Pt smokes 5-6 cigarettes a day  . Alcohol Use: No  . Drug Use: No  . Sexual Activity: Not Currently   Other Topics Concern  . None   Social History Narrative      Review of Systems see above  Vital Signs: BP 153/80 mmHg  Pulse 88  Temp(Src) 97.2 F (36.2 C) (Oral)  Resp 20  Ht 6' (1.829 m)  Wt 129 lb 9.6 oz (58.786 kg)  BMI 17.57 kg/m2  SpO2 100%  Physical Exam pt awake; responds appropriately to questions from brother; chest- dim BS rt base, left clear; palpable supraclavicular adenopathy ;heart- RRR; abd- soft,+BS,NT; extremities -no edema Mallampati Score:     Imaging: Dg Chest 2 View  07/13/2015   CLINICAL DATA:  Abdominal pain.  Unintentional weight loss.  EXAM: CHEST - 2 VIEW  COMPARISON:  None.  FINDINGS: Heart size is normal. Lungs are  hyperexpanded, suggesting emphysema. Nodular density over the right chest likely represents a nipple shadow additional nodule is seen in the right upper lobe measuring at 2.3 cm maximally. The left lung is clear. Atherosclerotic calcifications are present at the aortic arch. The visualized soft tissues and bony thorax are unremarkable.  IMPRESSION: 1. 2 cm nodular density at the right upper lobe. This is concerning for a primary lung neoplasm. Recommend CT of the chest with contrast for further evaluation. 2. Additional lower nodular density likely represents a nipple shadow but can also be evaluated by CT. 3. Emphysema. These results will be called to the ordering clinician or representative  by the Radiologist Assistant, and communication documented in the PACS or zVision Dashboard.   Electronically Signed   By: San Morelle M.D.   On: 07/13/2015 10:12   Ct Chest W Contrast  07/18/2015   ADDENDUM REPORT: 07/18/2015 10:21  ADDENDUM: Critical Value/emergent results were called by telephone at the time of interpretation on 07/18/2015 at 10:21 am to Dr. Lynne Leader , who verbally acknowledged these results.   Electronically Signed   By: Franchot Gallo M.D.   On: 07/18/2015 10:21   07/18/2015   CLINICAL DATA:  Lung nodule.  Weight loss.  EXAM: CT CHEST WITH CONTRAST  TECHNIQUE: Multidetector CT imaging of the chest was performed during intravenous contrast administration.  CONTRAST:  4m OMNIPAQUE IOHEXOL 300 MG/ML  SOLN  COMPARISON:  Chest x-ray 07/13/2015  FINDINGS: Right upper lobe nodule measures 17 x 20 mm. Soft tissue density without calcification.  Mediastinal adenopathy. Right peritracheal lymph node measures 27 mm. Left lower peritracheal lymph node 15 mm. Right upper paratracheal lymph nodes measuring approximately 15 mm. Enlarged lymph nodes in the lower neck bilaterally. These lymph nodes are consistent with metastatic disease. Findings are most consistent with carcinoma of the lung. Subcentimeter lymph nodes right hilum, possible tumor involvement.  Filling defect right lower lobe pulmonary artery in the medial base with branching in the smaller vessels, consistent with pulmonary embolism. No other filling defects identified.  Patchy airspace disease in the right posterior lung base and in the right posterior lung apex. Small right effusion. These findings are most likely related to pneumonia with tumor considered less likely. Left lung is clear.  Apical scarring bilaterally.  No skeletal lesions.  Moderate pericardial effusion. Malignancy is possible in the pericardium. Coronary artery calcification.  Upper abdomen reveals no mass lesion.  IMPRESSION: 17 x 20 mm right upper lobe nodule.  There is malignant appearing adenopathy in the mediastinum and lower neck, consistent with metastatic disease. 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  Pericardial effusion, possibly due to metastatic disease.  Right upper lobe infiltrate and right lower lobe infiltrate, suspicious for pneumonia. Small right effusion.  Right lower lobe isolated pulmonary embolism.  Electronically Signed: By: CFranchot GalloM.D. On: 07/18/2015 10:15   Ct Abdomen Pelvis W Contrast  07/19/2015   CLINICAL DATA:  Right lung mass, probable lung cancer. Staging. Left leg swelling. Pulmonary embolism.  EXAM: CT ABDOMEN AND PELVIS WITH CONTRAST  TECHNIQUE: Multidetector CT imaging of the abdomen and pelvis was performed using the standard protocol following bolus administration of intravenous contrast.  CONTRAST:  1046mOMNIPAQUE IOHEXOL 300 MG/ML  SOLN  COMPARISON:  CT chest 07/18/2015  FINDINGS: Lower chest: Small right pleural effusion with compressive atelectasis right lower lobe. Left lung is clear. Small pericardial effusion, slightly improved from yesterday.  Hepatobiliary: Ill-defined hypodensity adjacent to the falciform ligament without mass-effect. This fills in on delayed imaging  and most likely is an area of transient perfusion defect or hepatic steatosis. Heterogeneous liver diffusely without additional focal mass lesion, likely due to timing of injection. No definite liver metastasis. Gallbladder and bile ducts normal.  Pancreas: Negative  Spleen: Negative  Adrenals/Urinary Tract: Large parapelvic cyst on the left measuring 7.4 x 4.3 cm. Negative for hydronephrosis. Early excretion of contrast on the initial phase. No renal calculi. Urinary bladder normal.  Stomach/Bowel: Negative for bowel obstruction.  No bowel thickening.  Vascular/Lymphatic: Atherosclerotic calcification aorta and iliac vessels. Probable occlusion right femoral artery.  Thrombus in the left femoral vein and iliac vein compatible with DVT. Small pulmonary embolism noted  on yesterday's CT of the chest.  Reproductive: Prostate enlargement with prostate calcifications.  Other: Small amount of free fluid in the pelvis.  Musculoskeletal: Negative for fracture. Negative for skeletal metastasis. Mild lumbar disc degeneration.  IMPRESSION: Heterogeneous liver but no definite liver metastasis. Imaging findings are likely due to timing of the scan versus hepatic steatosis  Pericardial effusion which may be due to metastatic disease. This is slightly improved from yesterday. Small right pleural effusion  DVT left femoral and iliac vein.  Apparent occlusion right femoral artery.  Small amount of free fluid in the pelvis. No mass or adenopathy in the abdomen or pelvis.   Electronically Signed   By: Franchot Gallo M.D.   On: 07/19/2015 10:33   Dg Abd 2 Views  07/13/2015   CLINICAL DATA:  Abdominal pain with unintentional weight loss  EXAM: ABDOMEN - 2 VIEW  COMPARISON:  None.  FINDINGS: Supine and upright images obtained. There is diffuse stool throughout the colon and rectum. There is no bowel dilatation or air-fluid level suggesting obstruction. No free air. Lung bases are clear. There are surgical clips in the right upper quadrant region. There are vascular calcifications in the pelvis.  IMPRESSION: Diffuse stool throughout colon and rectum. No bowel obstruction. No free air. Lung bases clear. Nipple shadows noted incidentally bilaterally.   Electronically Signed   By: Lowella Grip III M.D.   On: 07/13/2015 10:11    Labs:  CBC:  Recent Labs  07/13/15 1013 07/18/15 1133 07/19/15 0310  WBC 10.5 10.0 9.2  HGB 7.8* 7.7* 6.5*  HCT 25.5* 24.9* 20.3*  PLT 344 332 324    COAGS:  Recent Labs  07/18/15 1356 07/19/15 0310  INR 2.20* 1.78*  APTT 38* 45*    BMP:  Recent Labs  07/13/15 1013 07/18/15 1133 07/19/15 0310  NA 138 133* 134*  K 3.8 4.0 3.7  CL 101 102 104  CO2 '21 22 25  '$ GLUCOSE 92 139* 99  BUN '13 14 13  '$ CALCIUM 8.6 8.4* 7.9*  CREATININE 0.73 0.85  0.75  GFRNONAA >89 >60 >60  GFRAA >89 >60 >60    LIVER FUNCTION TESTS:  Recent Labs  07/13/15 1013 07/19/15 0310  BILITOT 0.6 0.7  AST 23 18  ALT 17 14*  ALKPHOS 88 64  PROT 8.0 6.7  ALBUMIN 2.6* 1.7*    TUMOR MARKERS: No results for input(s): AFPTM, CEA, CA199, CHROMGRNA in the last 8760 hours.  Assessment and Plan: Jashua Knaak is a 74 y.o. male with PMH of hypertension, left bundle branch block, tobacco abuse, GERD, HLD, CVA, peptic ulcer disease with remote bleed '94-95 . He presented to the hospital 8/2 with history of weight loss, anorexia, dyspnea, cough and occasional chills. Subsequent imaging revealed a right lung nodule with mediastinal and lower neck adenopathy, pericardial effusion, right  upper lobe and right lower lobe infiltrate suspicious for pneumonia, right pleural effusion, right lower lobe PE, left femoral and iliac vein DVT, and occlusion of the right femoral artery. Request has now been made for ultrasound-guided biopsy of a supraclavicular lymph node. Imaging studies have been reviewed by Dr. Laurence Ferrari and safest, most amenable area to biopsy would be the supra clavicular lymph node. Details/risks of procedure, including but not limited to, internal bleeding, infection, injury to adjacent structures, discussed with pt/patient's brother with their understanding and consent. Procedure scheduled for later today. IV heparin has been held. Patient has been transfused secondary to hemoglobin of 6.5 this a.m. PT/INR 20.6/1.78   Thank you for this interesting consult.  I greatly enjoyed meeting Jared Rogers and look forward to participating in their care.  A copy of this report was sent to the requesting provider on this date.  Signed: D. Rowe Robert 07/19/2015, 10:59 AM   I spent a total of 20 minutes in face to face in clinical consultation, greater than 50% of which was counseling/coordinating care for ultrasound-guided supra clavicular lymph node  biopsy.

## 2015-07-19 NOTE — Progress Notes (Signed)
Patient back from IR post biopsy, alert and oriented, denies any pain. Biopsy site clean/dry/intact. Vitals WNL. Will continue to assess patient

## 2015-07-19 NOTE — Progress Notes (Signed)
OT Cancellation Note  Patient Details Name: Jared Rogers MRN: [id number] DOB: 12-23-1940   Cancelled Treatment:    Reason Eval/Treat Not Completed: Medical issues which prohibited therapy.  Pt's Hgb is 6.5. Will check back, likely tomorrow.  Yuma Blucher 07/19/2015, 10:46 AM  Lesle Chris, OTR/L [phone] 07/19/2015

## 2015-07-19 NOTE — Progress Notes (Signed)
*  Preliminary Results* Bilateral lower extremity venous duplex completed. Bilateral lower extremities are positive for deep vein thrombosis involving the right posterior tibial veins, left saphenofemoral junction, left common femoral, left profunda femoral, left popliteal, left posterior tibial, and left peroneal veins. There is no evidence of Baker's cyst bilaterally.  Preliminary results discussed with Dr. Coralyn Pear.  07/19/2015  Maudry Mayhew, RVT, RDCS, RDMS

## 2015-07-19 NOTE — Procedures (Signed)
Interventional Radiology Procedure Note  Procedure: US guided core biopsy right supraclavicular node  Complications: None  Estimated Blood Loss: 0  Recommendations: - Resume heparin in 2 hrs - Bedrest x 1 hr - Path pending  Signed,  Criselda Peaches, MD

## 2015-07-19 NOTE — Progress Notes (Signed)
Patient's daughter in law Amy stated she will take patient's wallet home

## 2015-07-19 NOTE — Progress Notes (Addendum)
[facility] PROGRESS NOTE  Bethany Cumming HQP:[id number] DOB: 27-Jun-1941 DOA: 07/18/2015 PCP: Scarlette Calico, MD  Assessment/Plan: 1. Probable lung cancer. -Patient with history of long-standing tobacco abuse presenting with a 30 pound weight loss and having complaints of shortness of breath. Workup included a CT scan of lungs with IV contrast that showed a right upper lobe nodule with malignant appearing adenopathy. -Patient undergoing ultrasound-guided biopsy of a supraclavicular lymph node -CT scan of abdomen and pelvis with IV contrast not show evidence of intra-abdominal metastasis -Await biopsy results.   2.  DVT/pulmonary embolism -Likely as a result of patient's hypercoagulable state in setting of malignancy -Bilateral extremity Dopplers showing DVT involving right posterior tibial veins, left saphenofemoral junction, left common femoral, left profunda femoral, left popliteal, left posterior tibial, and left radial veins. -Remains on IV heparin  3.  Pericardial effusion. -CT scan of lungs showing pericardial effusion -This was further worked up with transthoracic echocardiogram that showed small pericardial effusion no evidence of hemodynamic compromise.  4.  Acute anemia. -Hemoglobin trended down from 7.7 to 6.5 on am labs -He was typed and crossed and will transfuse 2 units of packed red blood cells  5.  Chronic systolic and diastolic congestive heart failure -Transthoracic echocardiogram showing EF of 35-40% with focal basal hypertrophy and akinesis of the mid apical anteroseptal myocardium. Grade 1 diastolic dysfunction evident. Comparing this to study from 04/19/2014 there has been a decrease in his ejection fraction. EF in 2015 was 55-60%. -EKG showing left bundle branch block was present on previous EKGs -He does not appear to have evidence of acute decompensated heart failure. Presently denies chest pain. -Case discussed with Dr Johnsie Cancel of cardiology, who  recommended medical management with ACE inhibitor and beta blocker. Did not recommend pursuing ischemic workup at this time.   6.  Protein calorie malnutrition -Patient with probable underlying malignancy -Reporting 30 pound weight loss, lab showing albumen 1.7 -Wide protein based  Code Status: Full code Family Communication: I spoke to his son who is present at bedside Disposition Plan: Pending biopsy results   Consultants:  Interventional radiology  Procedures:  Ultrasound-guided supraclavicular lymph node biopsy performed on 07/19/2015  Status post transfusion with 2 units of packed red blood cells  Antibiotics:  Ceftriaxone  Azithromycin  HPI/Subjective: Patient is a pleasant 74 year old gentleman with a past medical history of tobacco abuse admitted to the medicine service on 07/18/2015 when he presented with complaints of difficulty breathing. He had also reported a 30 pound weight loss in the past 3 months. He was worked up with a CT scan of lungs with IV contrast that revealed a 17 x 20 mm right upper lobe nodule associated with malignant-appearing adenopathy in the mediastinum and lower neck. There is also evidence of a pulmonary embolism. He was started on empiric IV antimicrobial therapy with ceftriaxone 1 g IV every 24 hours and azithromycin 500 mg IV every 24 hours. Labs showing a downward trend in hemoglobin, trending from 7.72 6.5. He was typed and crossed and transfused 2 units of packed red blood cells. Interventional radiology was consulted for ultrasound-guided supraclavicular lymph node biopsy that was performed on 07/19/2015.  Objective: Filed Vitals:   07/19/15 1111  BP: 158/88  Pulse: 86  Temp: 97.9 F (36.6 C)  Resp: 20    Intake/Output Summary (Last 24 hours) at 07/19/15 1301 Last data filed at 07/19/15 1208  Gross per 24 hour  Intake 2280.5 ml  Output   1350 ml  Net  930.5  ml   Filed Weights   07/18/15 1101 07/18/15 1449 07/19/15 0513   Weight: 55.792 kg (123 lb) 55.792 kg (123 lb) 58.786 kg (129 lb 9.6 oz)    Exam:   General:  Cachectic appearing, thin, wasted, no acute distress  Cardiovascular: Tachycardic, regular rate rhythm normal S1-S2  Respiratory: Diminished breath sounds bilaterally with positive scattered respiratory wheezes, few crackles, few rhonchi.  Abdomen: Soft nontender nondistended positive bowel sounds  Musculoskeletal: trace edema to left lower extremity  Data Reviewed:0    Basic Metabolic Panel:  Recent Labs Lab 07/13/15 1013 07/18/15 1133 07/19/15 0310  NA 138 133* 134*  K 3.8 4.0 3.7  CL 101 102 104  CO2 '21 22 25  '$ GLUCOSE 92 139* 99  BUN '13 14 13  '$ CREATININE 0.73 0.85 0.75  CALCIUM 8.6 8.4* 7.9*   Liver Function Tests:  Recent Labs Lab 07/13/15 1013 07/19/15 0310  AST 23 18  ALT 17 14*  ALKPHOS 88 64  BILITOT 0.6 0.7  PROT 8.0 6.7  ALBUMIN 2.6* 1.7*   No results for input(s): LIPASE, AMYLASE in the last 168 hours. No results for input(s): AMMONIA in the last 168 hours. CBC:  Recent Labs Lab 07/13/15 1013 07/18/15 1133 07/19/15 0310  WBC 10.5 10.0 9.2  NEUTROABS 7.4 7.7  --   HGB 7.8* 7.7* 6.5*  HCT 25.5* 24.9* 20.3*  MCV 82.5 81.4 80.6  PLT 344 332 324   Cardiac Enzymes: No results for input(s): CKTOTAL, CKMB, CKMBINDEX, TROPONINI in the last 168 hours. BNP (last 3 results) No results for input(s): BNP in the last 8760 hours.  ProBNP (last 3 results) No results for input(s): PROBNP in the last 8760 hours.  CBG: No results for input(s): GLUCAP in the last 168 hours.  No results found for this or any previous visit (from the past 240 hour(s)).   Studies: Ct Chest W Contrast  07/18/2015   ADDENDUM REPORT: 07/18/2015 10:21  ADDENDUM: Critical Value/emergent results were called by telephone at the time of interpretation on 07/18/2015 at 10:21 am to Dr. Lynne Leader , who verbally acknowledged these results.   Electronically Signed   By: Franchot Gallo M.D.    On: 07/18/2015 10:21   07/18/2015   CLINICAL DATA:  Lung nodule.  Weight loss.  EXAM: CT CHEST WITH CONTRAST  TECHNIQUE: Multidetector CT imaging of the chest was performed during intravenous contrast administration.  CONTRAST:  81m OMNIPAQUE IOHEXOL 300 MG/ML  SOLN  COMPARISON:  Chest x-ray 07/13/2015  FINDINGS: Right upper lobe nodule measures 17 x 20 mm. Soft tissue density without calcification.  Mediastinal adenopathy. Right peritracheal lymph node measures 27 mm. Left lower peritracheal lymph node 15 mm. Right upper paratracheal lymph nodes measuring approximately 15 mm. Enlarged lymph nodes in the lower neck bilaterally. These lymph nodes are consistent with metastatic disease. Findings are most consistent with carcinoma of the lung. Subcentimeter lymph nodes right hilum, possible tumor involvement.  Filling defect right lower lobe pulmonary artery in the medial base with branching in the smaller vessels, consistent with pulmonary embolism. No other filling defects identified.  Patchy airspace disease in the right posterior lung base and in the right posterior lung apex. Small right effusion. These findings are most likely related to pneumonia with tumor considered less likely. Left lung is clear.  Apical scarring bilaterally.  No skeletal lesions.  Moderate pericardial effusion. Malignancy is possible in the pericardium. Coronary artery calcification.  Upper abdomen reveals no mass lesion.  IMPRESSION: 17 x  20 mm right upper lobe nodule. There is malignant appearing adenopathy in the mediastinum and lower neck, consistent with metastatic disease. 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  Pericardial effusion, possibly due to metastatic disease.  Right upper lobe infiltrate and right lower lobe infiltrate, suspicious for pneumonia. Small right effusion.  Right lower lobe isolated pulmonary embolism.  Electronically Signed: By: Franchot Gallo M.D. On: 07/18/2015 10:15   Ct Abdomen Pelvis W Contrast  07/19/2015   CLINICAL  DATA:  Right lung mass, probable lung cancer. Staging. Left leg swelling. Pulmonary embolism.  EXAM: CT ABDOMEN AND PELVIS WITH CONTRAST  TECHNIQUE: Multidetector CT imaging of the abdomen and pelvis was performed using the standard protocol following bolus administration of intravenous contrast.  CONTRAST:  134m OMNIPAQUE IOHEXOL 300 MG/ML  SOLN  COMPARISON:  CT chest 07/18/2015  FINDINGS: Lower chest: Small right pleural effusion with compressive atelectasis right lower lobe. Left lung is clear. Small pericardial effusion, slightly improved from yesterday.  Hepatobiliary: Ill-defined hypodensity adjacent to the falciform ligament without mass-effect. This fills in on delayed imaging and most likely is an area of transient perfusion defect or hepatic steatosis. Heterogeneous liver diffusely without additional focal mass lesion, likely due to timing of injection. No definite liver metastasis. Gallbladder and bile ducts normal.  Pancreas: Negative  Spleen: Negative  Adrenals/Urinary Tract: Large parapelvic cyst on the left measuring 7.4 x 4.3 cm. Negative for hydronephrosis. Early excretion of contrast on the initial phase. No renal calculi. Urinary bladder normal.  Stomach/Bowel: Negative for bowel obstruction.  No bowel thickening.  Vascular/Lymphatic: Atherosclerotic calcification aorta and iliac vessels. Probable occlusion right femoral artery.  Thrombus in the left femoral vein and iliac vein compatible with DVT. Small pulmonary embolism noted on yesterday's CT of the chest.  Reproductive: Prostate enlargement with prostate calcifications.  Other: Small amount of free fluid in the pelvis.  Musculoskeletal: Negative for fracture. Negative for skeletal metastasis. Mild lumbar disc degeneration.  IMPRESSION: Heterogeneous liver but no definite liver metastasis. Imaging findings are likely due to timing of the scan versus hepatic steatosis  Pericardial effusion which may be due to metastatic disease. This is  slightly improved from yesterday. Small right pleural effusion  DVT left femoral and iliac vein.  Apparent occlusion right femoral artery.  Small amount of free fluid in the pelvis. No mass or adenopathy in the abdomen or pelvis.   Electronically Signed   By: CFranchot GalloM.D.   On: 07/19/2015 10:33    Scheduled Meds: . aspirin EC  81 mg Oral Daily  . azithromycin (ZITHROMAX) 500 MG IVPB  500 mg Intravenous Q24H  . cefTRIAXone (ROCEPHIN) IVPB 1 gram/50 mL D5W  1 g Intravenous Q24H  . feeding supplement  1 Container Oral BID BM  . pantoprazole  40 mg Oral Daily  . sodium chloride  3 mL Intravenous Q12H  . thiamine  100 mg Oral Daily   Continuous Infusions: . heparin Stopped (07/19/15 0915)    Principal Problem:   Lung mass Active Problems:   Hypertension   LBBB (left bundle branch block)   Anemia   GERD (gastroesophageal reflux disease)   Dementia with behavioral disturbance   Unintentional weight loss   CAP (community acquired pneumonia)   Pulmonary embolism   Tobacco abuse   Lymphadenopathy    Time spent: 35 minutes    ZKelvin Cellar [facility] Pager [phone] If 7PM-7AM, please contact night-coverage at [email or web address], password [email or web address]8/02/2015, 1:01 PM  LOS: 1 day

## 2015-07-19 NOTE — Progress Notes (Signed)
PT Cancellation Note  Patient Details Name: Jared Rogers MRN: [id number] DOB: 1941-04-16   Cancelled Treatment:    Reason Eval/Treat Not Completed: Medical issues which prohibited therapy Pt's Hgb 6.5 and order for PRBCs.  Also possible biopsy pending today.  Will check back as schedule permits.   Mignonne Afonso,KATHrine E 07/19/2015, 8:47 AM Carmelia Bake, PT, DPT 07/19/2015 Pager: [phone]

## 2015-07-19 NOTE — Progress Notes (Signed)
Initial Nutrition Assessment  DOCUMENTATION CODES:   Severe malnutrition in context of chronic illness, Underweight  INTERVENTION:  - Will order Boost Breeze BID, each supplement provides 250 kcal and 9 grams of protein - RD will continue to monitor for needs  NUTRITION DIAGNOSIS:   Malnutrition related to acute illness, chronic illness as evidenced by severe depletion of muscle mass, severe depletion of body fat.  GOAL:   Patient will meet greater than or equal to 90% of their needs  MONITOR:   PO intake, Supplement acceptance, Weight trends, Labs, I & O's  REASON FOR ASSESSMENT:   Consult Assessment of nutrition requirement/status  ASSESSMENT:   74 y.o. male Spanish-speaking male patient, married but spouse lives in [place] currently residing with his brother-in-law for the last year or so, has not seen PCP since June 2015, PMH of HTN, LBBB, GERD, tobacco abuse, HLD, CVA, PUD with bleeding ulcers and bowel obstruction in 1994-95, possible mild dementia, medical noncompliance, recently seen at [facility] on 07/13/15 to establish care and complained of profound weight loss, decreased appetite and was sent for CTA chest which was done today and showed RUL lung mass suspicious for lung cancer, pneumonia and RLL isolated pulmonary embolism. M.D. ordering CTA chest advised patient to come [facility] ED. History was obtained via a Medical illustrator Chartered loss adjuster who speaks fluent Spanish). Patient gives several months history of dyspnea which over the last week or 2 has progressively gotten worse. This is usually worse with exertion and occasionally has it at rest. Associated with cough and intermittent white sputum. Has chills without fevers. Mid anterior upper and lower substernal chest pain only on coughing. Decreased appetite. Has lost approximately 30 pounds over the last 4-6 weeks. No nausea, vomiting, diarrhea, rectal bleeding or black  stools.   Pt seen for consult. BMI indicates underweight status. Ultrasound being performed on pt's legs at time of visit; no family present. Per chart review, pt has had a poor appetite with ~30 lb weight loss over the past 4-6 months.   Physical assessment of upper body indicated severe muscle and fat wasting. Per weight hx review, pt has lost 2 lbs (1.5% body weight) in the past 6 days which is significant for time frame.  Pt is NPO and unable to meet needs. Medications reviewed. Labs reviewed; Na: 134 mmol/L, Ca: 7.9 mg/dL.  Diet Order:  Diet NPO time specified  Skin:  Reviewed, no issues  Last BM:  PTA  Height:   Ht Readings from Last 1 Encounters:  07/18/15 6' (1.829 m)    Weight:   Wt Readings from Last 1 Encounters:  07/19/15 129 lb 9.6 oz (58.786 kg)    Ideal Body Weight:  80.91 kg (kg)  BMI:  Body mass index is 17.57 kg/(m^2).  Estimated Nutritional Needs:   Kcal:  9833-8250  Protein:  85-95 grams  Fluid:  2.2 L/day  EDUCATION NEEDS:   No education needs identified at this time     Jarome Matin, RD, LDN Inpatient Clinical Dietitian Pager # [phone] After hours/weekend pager # [phone]

## 2015-07-19 NOTE — Progress Notes (Signed)
ANTICOAGULATION CONSULT NOTE - F/u Consult  Pharmacy Consult for Heparin Indication: pulmonary embolus  No Known Allergies  Patient Measurements: Height: 6' (182.9 cm) Weight: 123 lb (55.792 kg) IBW/kg (Calculated) : 77.6 Heparin Dosing Weight: 56kg  Vital Signs: Temp: 99.3 F (37.4 C) (08/02 2130) Temp Source: Oral (08/02 2130) BP: 143/79 mmHg (08/02 2130) Pulse Rate: 95 (08/02 2130)  Labs:  Recent Labs  07/18/15 1133 07/18/15 1354 07/18/15 1356 07/19/15 0310  HGB 7.7*  --   --  6.5*  HCT 24.9*  --   --  20.3*  PLT 332  --   --  324  APTT  --   --  38* 45*  LABPROT  --   --  24.2* 20.6*  INR  --   --  2.20* 1.78*  HEPARINUNFRC  --  >2.20*  --  1.72*  CREATININE 0.85  --   --  0.75    Estimated Creatinine Clearance: 64.9 mL/min (by C-G formula based on Cr of 0.75).   Medical History: Past Medical History  Diagnosis Date  . Depression   . Hx of migraines   . Alcohol abuse     In past  . GERD (gastroesophageal reflux disease)   . SOB (shortness of breath)     sometimes at night  . PUD (peptic ulcer disease)     with bleeding ulcers and bowel obstruction in 1994 and 1995  . DDD (degenerative disc disease), cervical   . DDD (degenerative disc disease), lumbosacral   . Hyperlipidemia   . Stroke     cerebellar CVA  . Shingles   . Asthmatic bronchitis   . Hypertension     Medications:  Prescriptions prior to admission  Medication Sig Dispense Refill Last Dose  . aspirin EC 81 MG tablet Take 81 mg by mouth daily.   07/18/2015 at Unknown time  . Cyanocobalamin (VITAMIN B-12 PO) Take 1 tablet by mouth daily.   07/18/2015 at Unknown time  . omeprazole (PRILOSEC) 40 MG capsule Take 1 capsule (40 mg total) by mouth daily. 30 capsule 3 07/18/2015 at Unknown time   Scheduled:  Infusions:   Assessment: 74yo M was referred to [facility] ER after an outpatient CT revealed pneumonia, chest mass, and PE. He received Xarelto '15mg'$  x 1 at 1134. Pharmacy is asked to start  a heparin infusion for anticoagulation in anticipation of lung mass biopsy on 8/3. Xarelto will influence the heparin level so we will be using a combination of aPTT and heparin levels to make dosing decisions for at least the first 24hrs.  No baseline coag labs have been drawn.  H/H is low, transfusion has been ordered.  Platelets are wnl.  Hx PUD on PPI at home.  Hx CVA on ASA at home.   Today 07/24/15  APtt=45 and HL=1.72, no problems per RN  Will continue to draw aPtt and HL until they correlate. (xarelto effect on HL diminishes)   Goal of Therapy:  Heparin level 0.3-0.7 units/ml aPTT 66-102 seconds Monitor platelets by anticoagulation protocol: Yes   Plan:   Rx using aPtt b/c of recent xarelto dose until they correlate  Increase heparin drip to 1100 units/hr (11 ml/hr)  Check heparin level/aPtt in 8hrs and daily thereafter.  Check CBC q24h while on heparin.  F/u daily.  MD to specify when heparin should be held prior to biopsy once it's scheduled.  Lawana Pai R . 07/19/2015,4:47 AM.

## 2015-07-20 DIAGNOSIS — J189 Pneumonia, unspecified organism: Secondary | ICD-10-CM

## 2015-07-20 LAB — BASIC METABOLIC PANEL
ANION GAP: 7 (ref 5–15)
BUN: 13 mg/dL (ref 6–20)
CHLORIDE: 104 mmol/L (ref 101–111)
CO2: 23 mmol/L (ref 22–32)
CREATININE: 0.75 mg/dL (ref 0.61–1.24)
Calcium: 8 mg/dL — ABNORMAL LOW (ref 8.9–10.3)
GFR calc Af Amer: >60 mL/min (ref >60)
GFR calc non Af Amer: >60 mL/min (ref >60)
Glucose, Bld: 104 mg/dL — ABNORMAL HIGH (ref 65–99)
POTASSIUM: 3.7 mmol/L (ref 3.5–5.1)
SODIUM: 134 mmol/L — AB (ref 135–145)

## 2015-07-20 LAB — CBC
HCT: 25.8 % — ABNORMAL LOW (ref 39.0–52.0)
HEMOGLOBIN: 8.5 g/dL — AB (ref 13.0–17.0)
MCH: 26.6 pg (ref 26.0–34.0)
MCHC: 32.9 g/dL (ref 30.0–36.0)
MCV: 80.9 fL (ref 78.0–100.0)
Platelets: 325 10*3/uL (ref 150–400)
RBC: 3.19 MIL/uL — ABNORMAL LOW (ref 4.22–5.81)
RDW: 16.8 % — ABNORMAL HIGH (ref 11.5–15.5)
WBC: 9.4 10*3/uL (ref 4.0–10.5)

## 2015-07-20 LAB — HEPARIN LEVEL (UNFRACTIONATED): Heparin Unfractionated: 0.42 IU/mL (ref 0.30–0.70)

## 2015-07-20 LAB — APTT: aPTT: 48 seconds — ABNORMAL HIGH (ref 24–37)

## 2015-07-20 MED ORDER — HEPARIN (PORCINE) IN NACL 100-0.45 UNIT/ML-% IJ SOLN
1300.0000 [IU]/h | INTRAMUSCULAR | Status: AC
  Administered 2015-07-20 (×2): 1200 [IU]/h via INTRAVENOUS
  Filled 2015-07-20: qty 250

## 2015-07-20 MED ORDER — ENOXAPARIN SODIUM 100 MG/ML ~~LOC~~ SOLN
90.0000 mg | SUBCUTANEOUS | Status: DC
  Administered 2015-07-20 – 2015-07-21 (×2): 90 mg via SUBCUTANEOUS
  Filled 2015-07-20 (×2): qty 1

## 2015-07-20 MED ORDER — LEVOFLOXACIN 750 MG PO TABS
750.0000 mg | ORAL_TABLET | Freq: Every day | ORAL | Status: DC
  Administered 2015-07-20 – 2015-07-21 (×2): 750 mg via ORAL
  Filled 2015-07-20 (×2): qty 1

## 2015-07-20 NOTE — Evaluation (Signed)
Physical Therapy Evaluation Patient Details Name: Jared Rogers MRN: [id number] DOB: 1941/02/28 Today's Date: 07/20/2015   History of Present Illness  74 year old gentleman with a past medical history of tobacco abuse, DDD, stroke admitted on 07/18/2015 with complaints of difficulty breathing. CT scan of lungs revealed a 17 x 20 mm right upper lobe nodule associated with malignant-appearing adenopathy in the mediastinum and lower neck. Evidence of a pulmonary embolism as well as DVTs. Interventional radiology was consulted for ultrasound-guided supraclavicular lymph node biopsy that was performed on 07/19/2015.  Clinical Impression  Pt admitted with above diagnosis. Pt currently with functional limitations due to the deficits listed below (see PT Problem List).   Pt will benefit from skilled PT to increase their independence and safety with mobility to allow discharge to the venue listed below.   Pt will have sons available to assist at home. Dr. Coralyn Pear present and interpreted while pt ambulating.  Pt aware to use RW upon d/c for safety and to have son assist with stairs for safety as well.     Follow Up Recommendations Home health PT;Supervision for mobility/OOB    Equipment Recommendations  Rolling walker with 5" wheels (if does not have at home)    Recommendations for Other Services       Precautions / Restrictions Precautions Precautions: Fall      Mobility  Bed Mobility               General bed mobility comments: Pt just starting with ambulation in hallway with Dr. Coralyn Pear on arrival  Transfers Overall transfer level: Needs assistance Equipment used: Rolling walker (2 wheeled) Transfers: Sit to/from Stand Sit to Stand: Min assist         General transfer comment: per Dr. Coralyn Pear, pt required some assist to stand, also required assist to control descent  Ambulation/Gait Ambulation/Gait assistance: Min guard;Min assist Ambulation Distance (Feet): 80  Feet Assistive device: Rolling walker (2 wheeled) Gait Pattern/deviations: Step-through pattern;Narrow base of support;Decreased stride length     General Gait Details: short steps with narrow BOS observed, increased trunk flexion, cues for using RW, increased reliance on UEs for support (typically does not use RW at home), recommended using RW upon d/c for safety  Stairs            Wheelchair Mobility    Modified Rankin (Stroke Patients Only)       Balance Overall balance assessment: Needs assistance         Standing balance support: Bilateral upper extremity supported Standing balance-Leahy Scale: Poor Standing balance comment: requires RW for support                             Pertinent Vitals/Pain Pain Assessment: No/denies pain    Home Living Family/patient expects to be discharged to:: Private residence Living Arrangements: Children Available Help at Discharge: Family Type of Home: [place]: Two level;Bed/bath upstairs Home Equipment: Environmental consultant - 2 wheels Additional Comments: lives with sons who can assist at home    Prior Function Level of Independence: Independent               Hand Dominance        Extremity/Trunk Assessment   Upper Extremity Assessment: Generalized weakness           Lower Extremity Assessment: Generalized weakness         Communication   Communication: Prefers language other  than [place] (Spanish, seen with Dr. Coralyn Pear)  Cognition Arousal/Alertness: Awake/alert Behavior During Therapy: [facility] for tasks assessed/performed Overall Cognitive Status: No family/caregiver present to determine baseline cognitive functioning (appears appropriate)                      General Comments General comments (skin integrity, edema, etc.): diffuse muscle atrophy throughout    Exercises        Assessment/Plan    PT Assessment Patient needs continued PT services  PT Diagnosis Difficulty  walking;Generalized weakness   PT Problem List Decreased strength;Decreased activity tolerance;Decreased mobility;Decreased balance;Decreased knowledge of use of DME  PT Treatment Interventions DME instruction;Gait training;Stair training;Functional mobility training;Patient/family education;Therapeutic activities;Therapeutic exercise   PT Goals (Current goals can be found in the Care Plan section) Acute Rehab PT Goals PT Goal Formulation: With patient Time For Goal Achievement: 07/27/15 Potential to Achieve Goals: Good    Frequency Min 3X/week   Barriers to discharge        Co-evaluation               End of Session   Activity Tolerance: Patient tolerated treatment well Patient left: in chair;with call bell/phone within reach Nurse Communication: Mobility status (observed pt ambulating in hallway)         Time: 1025-1036 PT Time Calculation (min) (ACUTE ONLY): 11 min   Charges:   PT Evaluation $Initial PT Evaluation Tier I: 1 Procedure     PT G Codes:        Michelene Keniston,KATHrine E 07/20/2015, 12:28 PM Carmelia Bake, PT, DPT 07/20/2015 Pager: [phone]

## 2015-07-20 NOTE — Progress Notes (Addendum)
ANTICOAGULATION CONSULT NOTE - Follow Up Consult  Pharmacy Consult for heparin  Indication: new pulmonary embolus and DVT  No Known Allergies  Patient Measurements: Height: 6' (182.9 cm) Weight: 130 lb 9.6 oz (59.24 kg) IBW/kg (Calculated) : 77.6 Heparin Dosing Weight: 59 kg  Vital Signs: Temp: 99 F (37.2 C) (08/04 0626) Temp Source: Oral (08/04 0626) BP: 150/82 mmHg (08/04 0751) Pulse Rate: 88 (08/04 0751)  Labs:  Recent Labs  07/18/15 1133  07/18/15 1356 07/19/15 0310 07/19/15 2250 07/20/15 0335 07/20/15 0830  HGB 7.7*  --   --  6.5*  --  8.5*  --   HCT 24.9*  --   --  20.3*  --  25.8*  --   PLT 332  --   --  324  --  325  --   APTT  --   < > 38* 45* 39*  --  48*  LABPROT  --   --  24.2* 20.6*  --   --   --   INR  --   --  2.20* 1.78*  --   --   --   HEPARINUNFRC  --   < >  --  1.72* 0.54  --  0.42  CREATININE 0.85  --   --  0.75  --  0.75  --   < > = values in this interval not displayed.  Estimated Creatinine Clearance: 68.9 mL/min (by C-G formula based on Cr of 0.75).   Assessment: 74yo M was referred to [facility] ER on 8/2 after an outpatient CT revealed pneumonia, chest mass, and PE. He received Xarelto '15mg'$  x 1 at 1134 on 8/2 and then anticoagulation changed to heparin. Of note, LE doppler today is positive for new DVT.  S/p lymph node biopsy on 8/3 with heparin drip turned off at 0900 this morning prior to procedure.  Per Dr. Laurence Ferrari, to resume heparin drip 2 hours after biopsy procedure.  Xarelto will influence the heparin level so we will be using a combination of aPTT and heparin levels to make dosing decisions.  Once heparin level correlate with aPTT, will then transition to heparin level monitoring only.  Today, 07/20/15:  - Heparin level is at goal with 0.42 but aPTT is low at 48 secs (levels not correlating) - hgb up 8.5 (s/p 2 units PRBC on 8/3), plt ok - no bleeding documented  Goal of Therapy:  Heparin level 0.3-0.7 units/ml aPTT 66-102  seconds Monitor platelets by anticoagulation protocol: Yes   Plan:  - Increase heparin drip to 1300 units/hr - check 8 hour heparin level and aPTT   Nilaya Bouie P 07/20/2015,9:19 AM  Adden: Per Dr. Coralyn Pear, Rx to transition from heparin to lovenox today.   - d/c heparin drip - start lovenox 90 mg SQ q24h (~1.5 mg/kg/day), give first dose one hour after discontinuing heparin drip - change cbc to [place], PharmD, BCPS 07/20/2015 10:24 AM

## 2015-07-20 NOTE — Progress Notes (Signed)
ANTICOAGULATION CONSULT NOTE - Follow Up Consult  Pharmacy Consult for Heparin Indication: new pulmonary embolus and DVT  No Known Allergies  Patient Measurements: Height: 6' (182.9 cm) Weight: 129 lb 9.6 oz (58.786 kg) IBW/kg (Calculated) : 77.6 Heparin Dosing Weight:   Vital Signs: Temp: 100.1 F (37.8 C) (08/03 2157) Temp Source: Oral (08/03 2157) BP: 129/70 mmHg (08/03 2157) Pulse Rate: 88 (08/03 2157)  Labs:  Recent Labs  07/18/15 1133 07/18/15 1354 07/18/15 1356 07/19/15 0310 07/19/15 2250 07/20/15 0335  HGB 7.7*  --   --  6.5*  --  8.5*  HCT 24.9*  --   --  20.3*  --  25.8*  PLT 332  --   --  324  --  325  APTT  --   --  38* 45* 39*  --   LABPROT  --   --  24.2* 20.6*  --   --   INR  --   --  2.20* 1.78*  --   --   HEPARINUNFRC  --  >2.20*  --  1.72* 0.54  --   CREATININE 0.85  --   --  0.75  --  0.75    Estimated Creatinine Clearance: 68.4 mL/min (by C-G formula based on Cr of 0.75).   Medications:  Infusions:  . heparin 1,200 Units/hr (07/20/15 0121)    Assessment: Patient with heparin level at goal but PTT below goal.  The levels do not correlate at this time.  Will use the PTT at this time.  No heparin issues per RN.  Goal of Therapy:  Heparin level 0.3-0.7 units/ml aPTT 66-102 seconds Monitor platelets by anticoagulation protocol: Yes   Plan:  Increase heparin to 1200 units/hr Recheck heparin level and PTT at 0800  Nani Skillern Crowford 07/20/2015,6:03 AM

## 2015-07-20 NOTE — Evaluation (Signed)
Occupational Therapy Evaluation Patient Details Name: Jared Rogers MRN: [id number] DOB: 07-08-1941 Today's Date: 07/20/2015    History of Present Illness 74 year old gentleman with a past medical history of tobacco abuse, DDD, stroke admitted on 07/18/2015 with complaints of difficulty breathing. CT scan of lungs revealed a 17 x 20 mm right upper lobe nodule associated with malignant-appearing adenopathy in the mediastinum and lower neck. Evidence of a pulmonary embolism as well as DVTs. Interventional radiology was consulted for ultrasound-guided supraclavicular lymph node biopsy that was performed on 07/19/2015.   Clinical Impression   Pt was admitted for the above.  Uncertain of PLOF.  Pt currently needs light mod A for LB adls, and this is for sit to stand.  Will follow in acute with min A level goals.    Follow Up Recommendations  Supervision/Assistance - 24 hour    Equipment Recommendations  3 in 1 bedside comode (if pt doesn't have one)    Recommendations for Other Services       Precautions / Restrictions Precautions Precautions: Fall Restrictions Weight Bearing Restrictions: No      Mobility Bed Mobility               General bed mobility comments: pt up in chair  Transfers Overall transfer level: Needs assistance Equipment used: Rolling walker (2 wheeled) Transfers: Sit to/from Stand Sit to Stand: Mod assist         General transfer comment: light mod A to stand from recliner    Balance Overall balance assessment: Needs assistance         Standing balance support: Bilateral upper extremity supported Standing balance-Leahy Scale: Poor Standing balance comment: requires RW for support                            ADL Overall ADL's : Needs assistance/impaired     Grooming: Set up;Sitting   Upper Body Bathing: Set up;Sitting   Lower Body Bathing: Moderate assistance;Sit to/from stand   Upper Body Dressing : Set up;Sitting   Lower Body Dressing: Moderate assistance;Sit to/from stand                 General ADL Comments: Pt needs mod A for sit to stand; knees painful when standing.  Pt was able to assist with pulling socks up once started     Vision     Perception     Praxis      Pertinent Vitals/Pain Pain Assessment: Faces Faces Pain Scale: Hurts little more Pain Location: knees Pain Intervention(s): Patient requesting pain meds-RN notified     Hand Dominance     Extremity/Trunk Assessment Upper Extremity Assessment Upper Extremity Assessment: Generalized weakness (grossly 4- to 4/5)          Communication Communication Communication: Prefers language other than English;Interpreter utilized (phone line)   Cognition Arousal/Alertness: Awake/alert Behavior During Therapy: WFL for tasks assessed/performed Overall Cognitive Status: No family/caregiver present to determine baseline cognitive functioning                General Comments: interpreter said that he had to redirect pt once with questioning (pt's response was very long and interpreter had a short answer, so I asked, if that was all that pt said)   General Comments       Exercises       Shoulder Instructions      Home Living Family/patient expects to be discharged to:: Private residence Living Arrangements: Children  Available Help at Discharge: Family Type of Home: House       Home Layout: Two level;Bed/bath upstairs Alternate Level Stairs-Number of Steps: flight             Home Equipment: Walker - 2 wheels   Additional Comments: lives with sons who can assist at home; will further ask about bathroom on next visit      Prior Functioning/Environment Level of Independence: Independent             OT Diagnosis: Generalized weakness   OT Problem List: Decreased strength;Decreased activity tolerance;Impaired balance (sitting and/or standing);Decreased knowledge of use of DME or AE;Pain   OT  Treatment/Interventions: Self-care/ADL training;DME and/or AE instruction;Patient/family education;Balance training    OT Goals(Current goals can be found in the care plan section) Acute Rehab OT Goals Patient Stated Goal: none stated; agreeable to OT evaluation OT Goal Formulation: With patient Time For Goal Achievement: 08/03/15 Potential to Achieve Goals: Good ADL Goals Pt Will Perform Lower Body Bathing: with min assist;sit to/from stand Pt Will Perform Lower Body Dressing: with min assist;sit to/from stand Pt Will Transfer to Toilet: with min assist;ambulating;bedside commode Pt Will Perform Toileting - Clothing Manipulation and hygiene: with min assist;sit to/from stand  OT Frequency: Min 2X/week   Barriers to D/C:            Co-evaluation              End of Session Nurse Communication: Patient requests pain meds  Activity Tolerance: Patient tolerated treatment well Patient left: in chair;with call bell/phone within reach;with chair alarm set   Time: 6503-5465 OT Time Calculation (min): 22 min Charges:  OT General Charges $OT Visit: 1 Procedure OT Evaluation $Initial OT Evaluation Tier I: 1 Procedure G-Codes:    Vastie Douty Aug 04, 2015, 2:30 PM Lesle Chris, OTR/L [phone] 04-Aug-2015

## 2015-07-20 NOTE — Care Management Important Message (Signed)
Important Message  Patient Details  Name: Jared Rogers MRN: [id number] Date of Birth: [birth year]   Medicare Important Message Given:  Yes-second notification given    Camillo Flaming 07/20/2015, 1:48 [place] Message  Patient Details  Name: Jared Rogers MRN: [id number] Date of Birth: Sep 27, 1941   Medicare Important Message Given:  Yes-second notification given    Camillo Flaming 07/20/2015, 1:48 PM

## 2015-07-20 NOTE — Progress Notes (Signed)
[facility] PROGRESS NOTE  Jared Rogers NID:[id number] DOB: 11-Dec-1941 DOA: 07/18/2015 PCP: Scarlette Calico, MD  Assessment/Plan: 1. Probable lung cancer. -Patient with history of long-standing tobacco abuse presenting with a 30 pound weight loss and having complaints of shortness of breath. Workup included a CT scan of lungs with IV contrast that showed a right upper lobe nodule with malignant appearing adenopathy. -Patient undergoing ultrasound-guided biopsy of a supraclavicular lymph node -CT scan of abdomen and pelvis with IV contrast not show evidence of intra-abdominal metastasis -Await biopsy results.  -Will transition to oral levaquin, stopped IV Ceftriaxone and Azithromycin. There was concern for PNA.   2.  DVT/pulmonary embolism -Likely as a result of patient's hypercoagulable state in setting of malignancy -Bilateral extremity Dopplers showing DVT involving right posterior tibial veins, left saphenofemoral junction, left common femoral, left profunda femoral, left popliteal, left posterior tibial, and left radial veins. -Given probable underlying malignancy will place him on Lovenox and discontinue IV heparin. Case was discussed with Pharmacy regarding Lovenox dosing.   3.  Pericardial effusion. -CT scan of lungs showing pericardial effusion -This was further worked up with transthoracic echocardiogram that showed small pericardial effusion no evidence of hemodynamic compromise.  4.  Acute anemia. -Hemoglobin trended down from 7.7 to 6.5 on am labs -He was typed and crossed and will transfuse 2 units of packed red blood cells on 07/20/2015 -Hg stable at 8.5  5.  Chronic systolic and diastolic congestive heart failure -Transthoracic echocardiogram showing EF of 35-40% with focal basal hypertrophy and akinesis of the mid apical anteroseptal myocardium. Grade 1 diastolic dysfunction evident. Comparing this to study from 04/19/2014 there has been a decrease in his ejection  fraction. EF in 2015 was 55-60%. -EKG showing left bundle branch block was present on previous EKGs -He does not appear to have evidence of acute decompensated heart failure. Presently denies chest pain. -Case discussed with Dr Johnsie Cancel of cardiology, who recommended medical management with ACE inhibitor and beta blocker. Did not recommend pursuing ischemic workup.   6.  Protein calorie malnutrition -Patient with probable underlying malignancy -Reporting 30 pound weight loss, lab showing albumen 1.7 -Provide protein boost.   7. Deconditioning -Patient was ambulated down hallway today. Had unsteady gait, weak and deconditioned.  -[place] services have been requested.  Code Status: Full code Family Communication: I spoke to his son who is present at bedside Disposition Plan: Pending biopsy results   Consultants:  Interventional radiology  Procedures:  Ultrasound-guided supraclavicular lymph node biopsy performed on 07/19/2015  Status post transfusion with 2 units of packed red blood cells  Antibiotics:  Ceftriaxone stopped on 07/20/2015  Azithromycin stopped on 07/20/2015  HPI/Subjective: Patient is a pleasant 74 year old gentleman with a past medical history of tobacco abuse admitted to the medicine service on 07/18/2015 when he presented with complaints of difficulty breathing. He had also reported a 30 pound weight loss in the past 3 months. He was worked up with a CT scan of lungs with IV contrast that revealed a 17 x 20 mm right upper lobe nodule associated with malignant-appearing adenopathy in the mediastinum and lower neck. There is also evidence of a pulmonary embolism. He was started on empiric IV antimicrobial therapy with ceftriaxone 1 g IV every 24 hours and azithromycin 500 mg IV every 24 hours. Labs showing a downward trend in hemoglobin, trending from 7.72 6.5. He was typed and crossed and transfused 2 units of packed red blood cells. Interventional radiology was consulted for  ultrasound-guided supraclavicular lymph  node biopsy that was performed on 07/19/2015.  Objective: Filed Vitals:   07/20/15 0945  BP: 127/74  Pulse: 80  Temp: 98.1 F (36.7 C)  Resp:     Intake/Output Summary (Last 24 hours) at 07/20/15 1059 Last data filed at 07/20/15 1049  Gross per 24 hour  Intake 1631.76 ml  Output    650 ml  Net 981.76 ml   Filed Weights   07/18/15 1449 07/19/15 0513 07/20/15 0626  Weight: 55.792 kg (123 lb) 58.786 kg (129 lb 9.6 oz) 59.24 kg (130 lb 9.6 oz)    Exam:   General:  Cachectic appearing, thin, wasted, no acute distress  Cardiovascular: Tachycardic, regular rate rhythm normal S1-S2  Respiratory: Diminished breath sounds bilaterally with positive scattered respiratory wheezes, few crackles, few rhonchi.  Abdomen: Soft nontender nondistended positive bowel sounds  Musculoskeletal: trace edema to left lower extremity  Data Reviewed:0    Basic Metabolic Panel:  Recent Labs Lab 07/18/15 1133 07/19/15 0310 07/20/15 0335  NA 133* 134* 134*  K 4.0 3.7 3.7  CL 102 104 104  CO2 '22 25 23  '$ GLUCOSE 139* 99 104*  BUN '14 13 13  '$ CREATININE 0.85 0.75 0.75  CALCIUM 8.4* 7.9* 8.0*   Liver Function Tests:  Recent Labs Lab 07/19/15 0310  AST 18  ALT 14*  ALKPHOS 64  BILITOT 0.7  PROT 6.7  ALBUMIN 1.7*   No results for input(s): LIPASE, AMYLASE in the last 168 hours. No results for input(s): AMMONIA in the last 168 hours. CBC:  Recent Labs Lab 07/18/15 1133 07/19/15 0310 07/20/15 0335  WBC 10.0 9.2 9.4  NEUTROABS 7.7  --   --   HGB 7.7* 6.5* 8.5*  HCT 24.9* 20.3* 25.8*  MCV 81.4 80.6 80.9  PLT 332 324 325   Cardiac Enzymes: No results for input(s): CKTOTAL, CKMB, CKMBINDEX, TROPONINI in the last 168 hours. BNP (last 3 results) No results for input(s): BNP in the last 8760 hours.  ProBNP (last 3 results) No results for input(s): PROBNP in the last 8760 hours.  CBG: No results for input(s): GLUCAP in the last 168  hours.  No results found for this or any previous visit (from the past 240 hour(s)).   Studies: Ct Abdomen Pelvis W Contrast  07/19/2015   CLINICAL DATA:  Right lung mass, probable lung cancer. Staging. Left leg swelling. Pulmonary embolism.  EXAM: CT ABDOMEN AND PELVIS WITH CONTRAST  TECHNIQUE: Multidetector CT imaging of the abdomen and pelvis was performed using the standard protocol following bolus administration of intravenous contrast.  CONTRAST:  114m OMNIPAQUE IOHEXOL 300 MG/ML  SOLN  COMPARISON:  CT chest 07/18/2015  FINDINGS: Lower chest: Small right pleural effusion with compressive atelectasis right lower lobe. Left lung is clear. Small pericardial effusion, slightly improved from yesterday.  Hepatobiliary: Ill-defined hypodensity adjacent to the falciform ligament without mass-effect. This fills in on delayed imaging and most likely is an area of transient perfusion defect or hepatic steatosis. Heterogeneous liver diffusely without additional focal mass lesion, likely due to timing of injection. No definite liver metastasis. Gallbladder and bile ducts normal.  Pancreas: Negative  Spleen: Negative  Adrenals/Urinary Tract: Large parapelvic cyst on the left measuring 7.4 x 4.3 cm. Negative for hydronephrosis. Early excretion of contrast on the initial phase. No renal calculi. Urinary bladder normal.  Stomach/Bowel: Negative for bowel obstruction.  No bowel thickening.  Vascular/Lymphatic: Atherosclerotic calcification aorta and iliac vessels. Probable occlusion right femoral artery.  Thrombus in the left femoral vein and  iliac vein compatible with DVT. Small pulmonary embolism noted on yesterday's CT of the chest.  Reproductive: Prostate enlargement with prostate calcifications.  Other: Small amount of free fluid in the pelvis.  Musculoskeletal: Negative for fracture. Negative for skeletal metastasis. Mild lumbar disc degeneration.  IMPRESSION: Heterogeneous liver but no definite liver metastasis.  Imaging findings are likely due to timing of the scan versus hepatic steatosis  Pericardial effusion which may be due to metastatic disease. This is slightly improved from yesterday. Small right pleural effusion  DVT left femoral and iliac vein.  Apparent occlusion right femoral artery.  Small amount of free fluid in the pelvis. No mass or adenopathy in the abdomen or pelvis.   Electronically Signed   By: Franchot Gallo M.D.   On: 07/19/2015 10:33   US Biopsy  07/19/2015   CLINICAL DATA:  74 year old male with newly diagnosed widespread metastatic disease and right lung mass concerning for primary lung cancer. Ultrasound-guided core biopsy of right supraclavicular adenopathy for tissue diagnosis.  EXAM: ULTRASOUND BIOPSY CORE LIVER  Date: 07/19/2015  PROCEDURE: 1. Ultrasound-guided core biopsy right supraclavicular node Interventional Radiologist:  Criselda Peaches, MD  ANESTHESIA/SEDATION: 1 mg Versed said administered for anxiolysis  MEDICATIONS: None required  TECHNIQUE: Informed consent was obtained from the patient following explanation of the procedure, risks, benefits and alternatives. The patient understands, agrees and consents for the procedure. All questions were addressed. A time out was performed.  The right supraclavicular fossa was interrogated with ultrasound. There are multiple enlarged hypoechoic lymph nodes. A suitable skin entry site was selected and marked. The region was sterilely prepped and draped in standard fashion using Betadine skin prep. Local anesthesia was attained by infiltration with 1% lidocaine. A small dermatotomy was made.  Under real-time sonographic guidance, multiple 18 gauge core biopsies were obtained using a [place] automated biopsy device. Biopsy specimens were placed in saline and delivered to pathology for further analysis.  Hemostasis was attained with gentle manual pressure. The patient tolerated the procedure well.  COMPLICATIONS: None  Estimated blood loss:   0  IMPRESSION: Technically successful ultrasound-guided core biopsy of a right supraclavicular node.  Signed,  Criselda Peaches, MD  [facility]  [facility]   Electronically Signed   By: Jacqulynn Cadet M.D.   On: 07/19/2015 14:34    Scheduled Meds: . aspirin EC  81 mg Oral Daily  . carvedilol  3.125 mg Oral BID WC  . enoxaparin (LOVENOX) injection  90 mg Subcutaneous Q24H  . feeding supplement  1 Container Oral BID BM  . levofloxacin  750 mg Oral Daily  . lisinopril  10 mg Oral Daily  . pantoprazole  40 mg Oral Daily  . sodium chloride  3 mL Intravenous Q12H  . thiamine  100 mg Oral Daily   Continuous Infusions: . heparin Stopped (07/20/15 1046)    Principal Problem:   Lung mass Active Problems:   Hypertension   LBBB (left bundle branch block)   Anemia   GERD (gastroesophageal reflux disease)   Dementia with behavioral disturbance   Unintentional weight loss   CAP (community acquired pneumonia)   Pulmonary embolism   Tobacco abuse   Lymphadenopathy   Protein-calorie malnutrition, severe    Time spent: 30 minutes    Kelvin Cellar  [facility] Pager [phone]. If 7PM-7AM, please contact night-coverage at [email or web address], password [facility] 07/20/2015, 10:59 AM  LOS: 2 days

## 2015-07-21 ENCOUNTER — Telehealth: Payer: Self-pay | Admitting: Internal Medicine

## 2015-07-21 DIAGNOSIS — I82402 Acute embolism and thrombosis of unspecified deep veins of left lower extremity: Secondary | ICD-10-CM

## 2015-07-21 MED ORDER — BOOST / RESOURCE BREEZE PO LIQD: 1.0000 | Freq: Two times a day (BID) | ORAL | Status: DC

## 2015-07-21 MED ORDER — ENOXAPARIN SODIUM 30 MG/0.3ML ~~LOC~~ SOLN: 90.0000 mg | SUBCUTANEOUS | Status: DC

## 2015-07-21 MED ORDER — CARVEDILOL 6.25 MG PO TABS: 6.2500 mg | ORAL_TABLET | Freq: Two times a day (BID) | ORAL | Status: DC

## 2015-07-21 MED ORDER — LISINOPRIL 10 MG PO TABS: 10.0000 mg | ORAL_TABLET | Freq: Every day | ORAL | Status: DC

## 2015-07-21 MED ORDER — PROMETHAZINE HCL 12.5 MG PO TABS: 12.5000 mg | ORAL_TABLET | Freq: Four times a day (QID) | ORAL | Status: DC | PRN

## 2015-07-21 MED ORDER — ENOXAPARIN (LOVENOX) PATIENT EDUCATION KIT
PACK | Freq: Once | Status: AC
  Administered 2015-07-21: 13:00:00
  Filled 2015-07-21: qty 1

## 2015-07-21 MED ORDER — SENNOSIDES-DOCUSATE SODIUM 8.6-50 MG PO TABS: 1.0000 | ORAL_TABLET | Freq: Every evening | ORAL | Status: DC | PRN

## 2015-07-21 MED ORDER — LEVOFLOXACIN 750 MG PO TABS: 750.0000 mg | ORAL_TABLET | Freq: Every day | ORAL | Status: DC

## 2015-07-21 NOTE — Discharge Summary (Signed)
Physician Discharge Summary  Deepak Bless OEV:[id number] DOB: [birth year] DOA: 07/18/2015  PCP: Scarlette Calico, MD  Admit date: 07/18/2015 Discharge date: 07/21/2015  Time spent: 35 minutes  Recommendations for Outpatient Follow-up:  1. Case discussed with Dr Marin Olp of Oncology, patient will receive short term follow up at the [facility] early next week with Dr Earlie Server.  2. Follow up on CBC, he required blood transfusion during this hospitalization, Hg improved to 8.5 on 07/20/2015 3. Please follow up on pathology report from supraclavicular biopsy, suspect lung cancer.    Discharge Diagnoses:  Principal Problem:   Lung mass Active Problems:   Hypertension   LBBB (left bundle branch block)   Anemia   GERD (gastroesophageal reflux disease)   Dementia with behavioral disturbance   Unintentional weight loss   CAP (community acquired pneumonia)   Pulmonary embolism   Tobacco abuse   Lymphadenopathy   Protein-calorie malnutrition, severe   Discharge Condition: Stable/Improved  Diet recommendation: Regular diet  Filed Weights   07/19/15 0513 07/20/15 0626 07/21/15 0428  Weight: 58.786 kg (129 lb 9.6 oz) 59.24 kg (130 lb 9.6 oz) 60.601 kg (133 lb 9.6 oz)    History of present illness:  Jared Rogers is a 74 y.o. 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 M.D. ordering CTA chest advised patient to come to  [facility] ED. History was obtained via a Medical illustrator Chartered loss adjuster who speaks fluent Spanish). Patient gives several months history of dyspnea which over the last week or 2 has progressively gotten worse. This is usually worse with exertion and occasionally has it at rest. Associated with cough and intermittent white sputum. Has chills without fevers. Mid anterior upper and lower substernal chest pain only on coughing. Decreased appetite. Has lost approximately 30 pounds over the last 4-6 weeks. No nausea, vomiting, diarrhea, rectal bleeding or black stools. Occasionally has dizziness in upright position. 3 days history of left leg swelling and longer history of left leg pain. CT results as indicated below. Lab work significant for hemoglobin of 7.7. Of note, patient had seen cardiology in August 2015 and had 2-D echo with normal LV function and stress test without ischemia. Patient has received a dose of Xarelto in the ED. Hospitalist admission was requested.  Hospital Course:  Patient is a pleasant 74 year old gentleman with a past medical history of tobacco abuse admitted to the medicine service on 07/18/2015 when he presented with complaints of difficulty breathing. He had also reported a 30 pound weight loss in the past 3 months. He was worked up with a CT scan of lungs with IV contrast that revealed a 17 x 20 mm right upper lobe nodule associated with malignant-appearing adenopathy in the mediastinum and lower neck. There is also evidence of a pulmonary embolism. He was started on empiric IV antimicrobial therapy with ceftriaxone 1 g IV every 24 hours and azithromycin 500 mg IV every 24 hours. Labs showing a downward trend in hemoglobin, trending from  7.72 6.5. He was typed and crossed and transfused 2 units of packed red blood cells. Interventional radiology was consulted for ultrasound-guided supraclavicular lymph node biopsy that was performed on 07/19/2015.   Probable lung  cancer. -Patient with history of long-standing tobacco abuse presenting with a 30 pound weight loss and having complaints of shortness of breath. Workup included a CT scan of lungs with IV contrast that showed a right upper lobe nodule with malignant appearing adenopathy. -Patient undergoing ultrasound-guided biopsy of a supraclavicular lymph node -CT scan of abdomen and pelvis with IV contrast not show evidence of intra-abdominal metastasis -Follow up on biopsy results.  -Case discussed with Dr Marin Olp of Oncology who will set patient up with short term follow up at the [facility] early next week.   2. DVT/pulmonary embolism -Likely as a result of patient's hypercoagulable state in setting of malignancy -Bilateral extremity Dopplers showing DVT involving right posterior tibial veins, left saphenofemoral junction, left common femoral, left profunda femoral, left popliteal, left posterior tibial, and left radial veins. -Given probable underlying malignancy he was placed on Lovenox -Pharmacy consulted will discharge on Lovenox 90 mg [place] q daily.   3. Pericardial effusion. -CT scan of lungs showing pericardial effusion -This was further worked up with transthoracic echocardiogram that showed small pericardial effusion no evidence of hemodynamic compromise.  4. Acute anemia. -Hemoglobin trended down from 7.7 to 6.5 on am labs -He was typed and crossed and will transfuse 2 units of packed red blood cells on 07/20/2015 -Hg stable at 8.5  5. Chronic systolic and diastolic congestive heart failure -Transthoracic echocardiogram showing EF of 35-40% with focal basal hypertrophy and akinesis of the mid apical anteroseptal myocardium. Grade 1 diastolic dysfunction evident. Comparing this to study from 04/19/2014 there has been a decrease in his ejection fraction. EF in 2015 was 55-60%. -EKG showing left bundle branch block was present on previous EKGs -He does not appear to have evidence of acute  decompensated heart failure. Presently denies chest pain. -Case discussed with Dr Johnsie Cancel of cardiology, who recommended medical management with ACE inhibitor and beta blocker. Did not recommend pursuing ischemic workup.   6. Protein calorie malnutrition -Patient with probable underlying malignancy -Reporting 30 pound weight loss, lab showing albumen 1.7 -Provide protein boost.   7. Deconditioning -Patient was ambulated down hallway today. Had unsteady gait, weak and deconditioned.  -[facility] services have been requested.  Procedures:  Ultrasound-guided supraclavicular lymph node biopsy performed on 07/19/2015  Status post transfusion with 2 units of packed red blood cells  Consultations:  Interventional Radiology  Discharge Exam: Filed Vitals:   07/21/15 1023  BP: 153/76  Pulse:   Temp:   Resp:      General: Cachectic appearing, thin, wasted, no acute distress  Cardiovascular: Tachycardic, regular rate rhythm normal S1-S2  Respiratory: Diminished breath sounds bilaterally lungs clear to auscultation  Abdomen: Soft nontender nondistended positive bowel sounds  Musculoskeletal: trace edema to left lower extremity  Discharge Instructions   Discharge Instructions    Call MD for:  difficulty breathing, headache or visual disturbances    Complete by:  As directed      Call MD for:  extreme fatigue    Complete by:  As directed      Call MD for:  hives    Complete by:  As directed      Call MD for:  persistant dizziness or light-headedness    Complete by:  As directed      Call MD for:  persistant nausea and vomiting    Complete by:  As directed      Call MD for:  redness, tenderness, or signs of infection (pain, swelling, redness, odor or green/yellow discharge around incision site)    Complete by:  As directed      Call MD for:  severe uncontrolled pain    Complete by:  As directed      Call MD for:  temperature >100.4    Complete by:  As directed      Call MD  for:    Complete by:  As directed      Diet - low sodium heart healthy    Complete by:  As directed      Increase activity slowly    Complete by:  As directed           Current Discharge Medication List    START taking these medications   Details  carvedilol (COREG) 6.25 MG tablet Take 1 tablet (6.25 mg total) by mouth 2 (two) times daily with a meal. Qty: 60 tablet, Refills: 1    enoxaparin (LOVENOX) 30 MG/0.3ML injection Inject 0.9 mLs (90 mg total) into the skin daily. Qty: 30 Syringe, Refills: 1    feeding supplement (BOOST / RESOURCE BREEZE) LIQD Take 1 Container by mouth 2 (two) times daily between meals. Qty: 20 Container, Refills: 0    levofloxacin (LEVAQUIN) 750 MG tablet Take 1 tablet (750 mg total) by mouth daily. Qty: 3 tablet, Refills: 0    lisinopril (PRINIVIL,ZESTRIL) 10 MG tablet Take 1 tablet (10 mg total) by mouth daily. Qty: 30 tablet, Refills: 1    promethazine (PHENERGAN) 12.5 MG tablet Take 1 tablet (12.5 mg total) by mouth every 6 (six) hours as needed for nausea. Qty: 20 tablet, Refills: 0    senna-docusate (SENOKOT-S) 8.6-50 MG per tablet Take 1 tablet by mouth at bedtime as needed for mild constipation. Qty: 30 tablet, Refills: 0      CONTINUE these medications which have NOT CHANGED   Details  omeprazole (PRILOSEC) 40 MG capsule Take 1 capsule (40 mg total) by mouth daily. Qty: 30 capsule, Refills: 3      STOP taking these medications     aspirin EC 81 MG tablet      Cyanocobalamin (VITAMIN B-12 PO)        No Known Allergies Follow-up Information    Follow up with Scarlette Calico, MD.   Specialty:  Internal Medicine   Contact information:   [address] [address] [phone]       Follow up with Eilleen Kempf., MD In 3 days.   Specialty:  Oncology   Contact information:   [address] [place] [address] [phone]        The results of significant diagnostics from this  hospitalization (including imaging, microbiology, ancillary and laboratory) are listed below for reference.    Significant Diagnostic Studies: Dg Chest 2 View  07/13/2015   CLINICAL DATA:  Abdominal pain.  Unintentional weight loss.  EXAM: CHEST - 2 VIEW  COMPARISON:  None.  FINDINGS: Heart size is normal. Lungs are hyperexpanded, suggesting emphysema. Nodular density over the right chest likely represents a nipple shadow additional nodule is seen in the right upper lobe measuring at 2.3 cm maximally. The left lung is clear. Atherosclerotic calcifications are present at the aortic arch. The visualized soft tissues and bony thorax are unremarkable.  IMPRESSION: 1. 2 cm nodular density at the  right upper lobe. This is concerning for a primary lung neoplasm. Recommend CT of the chest with contrast for further evaluation. 2. Additional lower nodular density likely represents a nipple shadow but can also be evaluated by CT. 3. Emphysema. These results will be called to the ordering clinician or representative by the Radiologist Assistant, and communication documented in the PACS or zVision Dashboard.   Electronically Signed   By: San Morelle M.D.   On: 07/13/2015 10:12   Ct Chest W Contrast  07/18/2015   ADDENDUM REPORT: 07/18/2015 10:21  ADDENDUM: Critical Value/emergent results were called by telephone at the time of interpretation on 07/18/2015 at 10:21 am to Dr. Lynne Leader , who verbally acknowledged these results.   Electronically Signed   By: Franchot Gallo M.D.   On: 07/18/2015 10:21   07/18/2015   CLINICAL DATA:  Lung nodule.  Weight loss.  EXAM: CT CHEST WITH CONTRAST  TECHNIQUE: Multidetector CT imaging of the chest was performed during intravenous contrast administration.  CONTRAST:  26m OMNIPAQUE IOHEXOL 300 MG/ML  SOLN  COMPARISON:  Chest x-ray 07/13/2015  FINDINGS: Right upper lobe nodule measures 17 x 20 mm. Soft tissue density without calcification.  Mediastinal adenopathy. Right  peritracheal lymph node measures 27 mm. Left lower peritracheal lymph node 15 mm. Right upper paratracheal lymph nodes measuring approximately 15 mm. Enlarged lymph nodes in the lower neck bilaterally. These lymph nodes are consistent with metastatic disease. Findings are most consistent with carcinoma of the lung. Subcentimeter lymph nodes right hilum, possible tumor involvement.  Filling defect right lower lobe pulmonary artery in the medial base with branching in the smaller vessels, consistent with pulmonary embolism. No other filling defects identified.  Patchy airspace disease in the right posterior lung base and in the right posterior lung apex. Small right effusion. These findings are most likely related to pneumonia with tumor considered less likely. Left lung is clear.  Apical scarring bilaterally.  No skeletal lesions.  Moderate pericardial effusion. Malignancy is possible in the pericardium. Coronary artery calcification.  Upper abdomen reveals no mass lesion.  IMPRESSION: 17 x 20 mm right upper lobe nodule. There is malignant appearing adenopathy in the mediastinum and lower neck, consistent with metastatic disease. 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  Pericardial effusion, possibly due to metastatic disease.  Right upper lobe infiltrate and right lower lobe infiltrate, suspicious for pneumonia. Small right effusion.  Right lower lobe isolated pulmonary embolism.  Electronically Signed: By: CFranchot GalloM.D. On: 07/18/2015 10:15   Ct Abdomen Pelvis W Contrast  07/19/2015   CLINICAL DATA:  Right lung mass, probable lung cancer. Staging. Left leg swelling. Pulmonary embolism.  EXAM: CT ABDOMEN AND PELVIS WITH CONTRAST  TECHNIQUE: Multidetector CT imaging of the abdomen and pelvis was performed using the standard protocol following bolus administration of intravenous contrast.  CONTRAST:  1026mOMNIPAQUE IOHEXOL 300 MG/ML  SOLN  COMPARISON:  CT chest 07/18/2015  FINDINGS: Lower chest: Small right pleural effusion  with compressive atelectasis right lower lobe. Left lung is clear. Small pericardial effusion, slightly improved from yesterday.  Hepatobiliary: Ill-defined hypodensity adjacent to the falciform ligament without mass-effect. This fills in on delayed imaging and most likely is an area of transient perfusion defect or hepatic steatosis. Heterogeneous liver diffusely without additional focal mass lesion, likely due to timing of injection. No definite liver metastasis. Gallbladder and bile ducts normal.  Pancreas: Negative  Spleen: Negative  Adrenals/Urinary Tract: Large parapelvic cyst on the left measuring 7.4 x 4.3 cm. Negative for hydronephrosis.  Early excretion of contrast on the initial phase. No renal calculi. Urinary bladder normal.  Stomach/Bowel: Negative for bowel obstruction.  No bowel thickening.  Vascular/Lymphatic: Atherosclerotic calcification aorta and iliac vessels. Probable occlusion right femoral artery.  Thrombus in the left femoral vein and iliac vein compatible with DVT. Small pulmonary embolism noted on yesterday's CT of the chest.  Reproductive: Prostate enlargement with prostate calcifications.  Other: Small amount of free fluid in the pelvis.  Musculoskeletal: Negative for fracture. Negative for skeletal metastasis. Mild lumbar disc degeneration.  IMPRESSION: Heterogeneous liver but no definite liver metastasis. Imaging findings are likely due to timing of the scan versus hepatic steatosis  Pericardial effusion which may be due to metastatic disease. This is slightly improved from yesterday. Small right pleural effusion  DVT left femoral and iliac vein.  Apparent occlusion right femoral artery.  Small amount of free fluid in the pelvis. No mass or adenopathy in the abdomen or pelvis.   Electronically Signed   By: Franchot Gallo M.D.   On: 07/19/2015 10:33   US Biopsy  07/20/2015   ADDENDUM REPORT: 07/20/2015 17:07  ADDENDUM: EXAM:  US BIOPSY CORE LYMPH NODE   Electronically Signed   By:  Jacqulynn Cadet M.D.   On: 07/20/2015 17:07   07/20/2015   CLINICAL DATA:  74 year old male with newly diagnosed widespread metastatic disease and right lung mass concerning for primary lung cancer. Ultrasound-guided core biopsy of right supraclavicular adenopathy for tissue diagnosis.  EXAM: ULTRASOUND BIOPSY CORE LIVER  Date: 07/19/2015  PROCEDURE: 1. Ultrasound-guided core biopsy right supraclavicular node Interventional Radiologist:  Criselda Peaches, MD  ANESTHESIA/SEDATION: 1 mg Versed said administered for anxiolysis  MEDICATIONS: None required  TECHNIQUE: Informed consent was obtained from the patient following explanation of the procedure, risks, benefits and alternatives. The patient understands, agrees and consents for the procedure. All questions were addressed. A time out was performed.  The right supraclavicular fossa was interrogated with ultrasound. There are multiple enlarged hypoechoic lymph nodes. A suitable skin entry site was selected and marked. The region was sterilely prepped and draped in standard fashion using Betadine skin prep. Local anesthesia was attained by infiltration with 1% lidocaine. A small dermatotomy was made.  Under real-time sonographic guidance, multiple 18 gauge core biopsies were obtained using a [employer] [place] automated biopsy device. Biopsy specimens were placed in saline and delivered to pathology for further analysis.  Hemostasis was attained with gentle manual pressure. The patient tolerated the procedure well.  COMPLICATIONS: None  Estimated blood loss:  0  IMPRESSION: Technically successful ultrasound-guided core biopsy of a right supraclavicular node.  Signed,  Criselda Peaches, MD  [facility]  [facility]  Electronically Signed: By: Jacqulynn Cadet M.D. On: 07/19/2015 14:34   Dg Abd 2 Views  07/13/2015   CLINICAL DATA:  Abdominal pain with unintentional weight loss  EXAM: ABDOMEN - 2 VIEW  COMPARISON:  None.   FINDINGS: Supine and upright images obtained. There is diffuse stool throughout the colon and rectum. There is no bowel dilatation or air-fluid level suggesting obstruction. No free air. Lung bases are clear. There are surgical clips in the right upper quadrant region. There are vascular calcifications in the pelvis.  IMPRESSION: Diffuse stool throughout colon and rectum. No bowel obstruction. No free air. Lung bases clear. Nipple shadows noted incidentally bilaterally.   Electronically Signed   By: Lowella Grip III M.D.   On: 07/13/2015 10:11    Microbiology: No results found for  this or any previous visit (from the past 240 hour(s)).   Labs: Basic Metabolic Panel:  Recent Labs Lab 07/18/15 1133 07/19/15 0310 07/20/15 0335  NA 133* 134* 134*  K 4.0 3.7 3.7  CL 102 104 104  CO2 '22 25 23  '$ GLUCOSE 139* 99 104*  BUN '14 13 13  '$ CREATININE 0.85 0.75 0.75  CALCIUM 8.4* 7.9* 8.0*   Liver Function Tests:  Recent Labs Lab 07/19/15 0310  AST 18  ALT 14*  ALKPHOS 64  BILITOT 0.7  PROT 6.7  ALBUMIN 1.7*   No results for input(s): LIPASE, AMYLASE in the last 168 hours. No results for input(s): AMMONIA in the last 168 hours. CBC:  Recent Labs Lab 07/18/15 1133 07/19/15 0310 07/20/15 0335  WBC 10.0 9.2 9.4  NEUTROABS 7.7  --   --   HGB 7.7* 6.5* 8.5*  HCT 24.9* 20.3* 25.8*  MCV 81.4 80.6 80.9  PLT 332 324 325   Cardiac Enzymes: No results for input(s): CKTOTAL, CKMB, CKMBINDEX, TROPONINI in the last 168 hours. BNP: BNP (last 3 results) No results for input(s): BNP in the last 8760 hours.  ProBNP (last 3 results) No results for input(s): PROBNP in the last 8760 hours.  CBG: No results for input(s): GLUCAP in the last 168 hours.     SignedKelvin Cellar  [facility] 07/21/2015, 10:36 AM

## 2015-07-21 NOTE — Progress Notes (Addendum)
Prior Authorization for Lovenox request form faxed to [phone] [facility].

## 2015-07-21 NOTE — Progress Notes (Signed)
Spoke with pt concerning HH. Pt asked me to speak with son or daughter Amy. Amy was called [phone], she selected [facility] for [facility] needs. Referral given to [facility] in house rep.

## 2015-07-21 NOTE — Progress Notes (Signed)
Patient and family member reviewed discharge instructions. Patient and family educated on Lovenox injection. Patient and family verbalized understanding of discharge instructions. Patient has no signs of infection. No wounds noticed. Vital signs are stable.

## 2015-07-21 NOTE — Progress Notes (Signed)
Physical Therapy Treatment Patient Details Name: Jared Rogers MRN: [id number] DOB: August 17, 1941 Today's Date: 07/21/2015    History of Present Illness 74 year old gentleman with a past medical history of tobacco abuse, DDD, stroke admitted on 07/18/2015 with complaints of difficulty breathing. CT scan of lungs revealed a 17 x 20 mm right upper lobe nodule associated with malignant-appearing adenopathy in the mediastinum and lower neck. Evidence of a pulmonary embolism as well as DVTs. Interventional radiology was consulted for ultrasound-guided supraclavicular lymph node biopsy that was performed on 07/19/2015.    PT Comments    Patient used RW with safer technique with cues, plans DC today, possibly  Follow Up Recommendations  Home health PT;Supervision for mobility/OOB     Equipment Recommendations  Rolling walker with 5" wheels    Recommendations for Other Services       Precautions / Restrictions Precautions Precautions: Fall Restrictions Weight Bearing Restrictions: No    Mobility  Bed Mobility Overal bed mobility: Modified Independent             General bed mobility comments: sit to supine; HOB was raised  Transfers   Equipment used: Rolling walker (2 wheeled) Transfers: Sit to/from Stand Sit to Stand: Min guard         General transfer comment: for safety  Ambulation/Gait Ambulation/Gait assistance: Min guard Ambulation Distance (Feet): 80 Feet   Gait Pattern/deviations: Step-through pattern;Narrow base of support     General Gait Details: multimodal cues to stay inside of RW, cues for turns.   Stairs            Wheelchair Mobility    Modified Rankin (Stroke Patients Only)       Balance                                    Cognition Arousal/Alertness: Awake/alert Behavior During Therapy: WFL for tasks assessed/performed Overall Cognitive Status: No family/caregiver present to determine baseline cognitive  functioning                 General Comments: interpreter had to repeat one question 3x's as pt wasn't getting what he asked.      Exercises      General Comments        Pertinent Vitals/Pain Pain Assessment: No/denies pain Faces Pain Scale: Hurts little more Pain Location: K knee Pain Intervention(s): Monitored during session    Home Living Family/patient expects to be discharged to:: Private residence Living Arrangements: Children Available Help at Discharge: Family           Additional Comments: pt states sons are there at night.  He can have friends stay with him as needed    Prior Function Level of Independence: Independent          PT Goals (current goals can now be found in the care plan section) Progress towards PT goals: Progressing toward goals    Frequency  Min 3X/week    PT Plan Current plan remains appropriate    Co-evaluation             End of Session Equipment Utilized During Treatment: Gait belt Activity Tolerance: Patient tolerated treatment well Patient left: in bed;with call bell/phone within reach;with bed alarm set     Time: 1355-1412 PT Time Calculation (min) (ACUTE ONLY): 17 min  Charges:  G Codes:      Claretha Cooper 07/21/2015, 2:59 PM

## 2015-07-21 NOTE — Progress Notes (Signed)
Call [facility] [phone] to check on Lovenox coverage co pay.  Generic Enoxaparin co-pay cost is $15.00.

## 2015-07-21 NOTE — Telephone Encounter (Signed)
NEW PATIENT APPT-S/W RON AND GAVE NP APPT FOR 08/10 @ 1:45 W/DR. [place] DX-LUNG CA

## 2015-07-21 NOTE — Progress Notes (Signed)
Occupational Therapy Treatment Patient Details Name: Jared Rogers MRN: [id number] DOB: 1941/10/21 Today's Date: 07/21/2015    History of present illness 74 year old gentleman with a past medical history of tobacco abuse, DDD, stroke admitted on 07/18/2015 with complaints of difficulty breathing. CT scan of lungs revealed a 17 x 20 mm right upper lobe nodule associated with malignant-appearing adenopathy in the mediastinum and lower neck. Evidence of a pulmonary embolism as well as DVTs. Interventional radiology was consulted for ultrasound-guided supraclavicular lymph node biopsy that was performed on 07/19/2015.   OT comments  Noted plan is for discharge today.  Utilized interpreter phone to further assess for DME.  Recommend initial 24/7 supervision.  Pt usually is alone during the day.  Pt moved much better  Follow Up Recommendations  Supervision/Assistance - 24 hour    Equipment Recommendations  3 in 1 bedside comode    Recommendations for Other Services      Precautions / Restrictions Precautions Precautions: Fall Restrictions Weight Bearing Restrictions: No       Mobility Bed Mobility Overal bed mobility: Modified Independent             General bed mobility comments: sit to supine; HOB was raised  Transfers   Equipment used: Rolling walker (2 wheeled) Transfers: Sit to/from Stand Sit to Stand: Min guard         General transfer comment: for safety    Balance                                   ADL                           Toilet Transfer: Min guard;BSC;RW;Ambulation (using bil grab bars)             General ADL Comments: utilized interpreter. Pt has a comfort height commode but he doesn't have anything to push up from.  He used rails, twice despite cues not to use them.  Placed 3:1 over commode and he liked it, but he wasn't sure it  would fit. Brother arrived as I was leaving, and he said that it would fit.  Pt likes  3:1.  When walking to bathroom, pt had walker pushed too far away; had interpreter tell him to step up inbetween hands and used tactile cues/assisting to bring walker back, but he did not carry through with this. Pt was steady        Tourist information centre manager   Behavior During Therapy: [facility] for tasks assessed/performed Overall Cognitive Status: No family/caregiver present to determine baseline cognitive functioning                  General Comments: interpreter had to repeat one question 3x's as pt wasn't getting what he asked.      Extremity/Trunk Assessment               Exercises     Shoulder Instructions       General Comments      Pertinent Vitals/ Pain       Pain Assessment: No/denies pain  Home Living Family/patient expects to be discharged to:: Private residence Living Arrangements: Children Available Help at Discharge: Family  Bathroom Shower/Tub: Teacher, early years/pre: Handicapped height (no rails next to commode)         Additional Comments: pt states sons are there at night.  He can have friends stay with him as needed      Prior Functioning/Environment Level of Independence: Independent            Frequency       Progress Toward Goals  OT Goals(current goals can now be found in the care plan section)  Progress towards OT goals: Progressing toward goals     Plan      Co-evaluation                 End of Session     Activity Tolerance Patient tolerated treatment well   Patient Left in bed;with call bell/phone within reach;with bed alarm set   Nurse Communication          Time: 9753-0051 OT Time Calculation (min): 28 min  Charges: OT General Charges $OT Visit: 1 Procedure  Maresha Anastos 07/21/2015, 1:03 PM  Lesle Chris, OTR/L [phone] 07/21/2015

## 2015-07-22 LAB — TYPE AND SCREEN
ABO/RH(D): A POS
Antibody Screen: NEGATIVE
Unit division: 0
Unit division: 0
Unit division: 0

## 2015-07-25 ENCOUNTER — Telehealth: Payer: Self-pay | Admitting: Internal Medicine

## 2015-07-25 NOTE — Telephone Encounter (Signed)
New patient appt-s/w Amy patient caretaker and gave new appt for 08/12 @ 8:45 w/Dr. Julien Nordmann

## 2015-07-26 ENCOUNTER — Ambulatory Visit: Payer: PPO | Admitting: Internal Medicine

## 2015-07-26 ENCOUNTER — Ambulatory Visit: Payer: PPO

## 2015-07-26 ENCOUNTER — Other Ambulatory Visit: Payer: PPO

## 2015-07-28 ENCOUNTER — Ambulatory Visit: Payer: PPO

## 2015-07-28 ENCOUNTER — Encounter: Payer: Self-pay | Admitting: Internal Medicine

## 2015-07-28 ENCOUNTER — Ambulatory Visit (HOSPITAL_BASED_OUTPATIENT_CLINIC_OR_DEPARTMENT_OTHER): Payer: PPO | Admitting: Internal Medicine

## 2015-07-28 ENCOUNTER — Telehealth: Payer: Self-pay | Admitting: Internal Medicine

## 2015-07-28 ENCOUNTER — Other Ambulatory Visit (HOSPITAL_BASED_OUTPATIENT_CLINIC_OR_DEPARTMENT_OTHER): Payer: PPO

## 2015-07-28 VITALS — BP 111/62 | HR 81 | Temp 98.4°F | Resp 18 | Ht 72.0 in | Wt 138.0 lb

## 2015-07-28 DIAGNOSIS — C3491 Malignant neoplasm of unspecified part of right bronchus or lung: Secondary | ICD-10-CM

## 2015-07-28 DIAGNOSIS — E46 Unspecified protein-calorie malnutrition: Secondary | ICD-10-CM | POA: Diagnosis not present

## 2015-07-28 DIAGNOSIS — D638 Anemia in other chronic diseases classified elsewhere: Secondary | ICD-10-CM

## 2015-07-28 DIAGNOSIS — C3490 Malignant neoplasm of unspecified part of unspecified bronchus or lung: Secondary | ICD-10-CM | POA: Insufficient documentation

## 2015-07-28 NOTE — Telephone Encounter (Signed)
Pt confirmed labs/ov per 08/12 POF, gave pt AVS and Calendar.... Cherylann Banas, sent msg to [facility] for referral for GI, scheduled also BN/Nut.Marland KitchenMarland KitchenMarland Kitchen

## 2015-07-28 NOTE — Progress Notes (Signed)
Checked in new patient. Pt's contact person didn't think that chemo would be a treatment option but has Lenise's card for any financial concerns or questions. Pt's contact person  made copay of $30 via [employer].Receipt given.

## 2015-07-28 NOTE — Progress Notes (Signed)
[place] [phone]   [phone]  CONSULT NOTE  REFERRING PHYSICIAN: Dr. 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 male with past medical history significant for hypertension, anemia, GERD, pneumonia, pulmonary embolism as well as left bundle branch block. The patient has been complaining of increasing weakness and fatigue as well as shortness breath and weight loss for several weeks. He was seen by his primary care physician Dr. Georgina Snell. Chest x-ray as well as x-ray of the abdomen were performed on 07/13/2015 and it showed 2.0 cm nodular density at the right upper lobe concerning for primary lung neoplasm. This was followed by CT scan of the chest performed on 07/18/2015 and it showed 1.7 x 2.0 cm right upper lobe nodule with malignant appearing adenopathy in the mediastinum and lower neck consistent with metastatic disease questionable for lung cancer. There was also pericardial effusion likely due to metastatic disease and a right upper lobe infiltrate as well as right lower lobe infiltrate suspicious for pneumonia. The scan also showed right lower lobe isolated pulmonary embolism. The patient was admitted to [facility] for further evaluation. CT scan of the abdomen and pelvis showed heterogeneous liver but no definite liver metastasis. There was also due venous thrombosis in the left femoral and iliac vein. There was also apparent occlusion of the right femoral artery. No mass or adenopathy in the abdomen or pelvis. The patient was started on anticoagulation with Xarelto. 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 The carcinoma demonstrates the following immunophenotype: Cytokeratin 7 - strong diffuse expression, Cytokeratin 20 - negative expression, CDX2 - negative expression, Napsin-A - negative expression, TTF-1 - negative expression. The morphologic features demonstrate poorly differentiated adenocarcinoma with signet cell differentiation. The morphologic and immunophenotype is not specific and the differential considerations include upper gastrointestinal primary, pancreatico-biliary primary and primary lung. The patient was referred to me today for further evaluation and recommendation regarding his condition. He was accompanied by his son as well as to other family member in addition to the [place] interpreter.  He continues to have pain in the left leg as well as shortness breath with exertion and occasional chest pain with cough productive of clear 6 sputum. He lost around 30 pounds in the last few months. The patient denied having any nausea or vomiting but has intermittent constipation. He denied having any significant headache or visual changes. Family history significant for mother and father with heart disease and sister had lung cancer. The patient is married but separated. He is originally from [place] and his wife still lives there. He has 3 children. He used to work in preventing. He has a history of smoking for around 15 years and just quit a week ago. He denied having any history of alcohol or drug abuse.  HPI  Past Medical History  Diagnosis Date  . Depression   . Hx of migraines   . Alcohol abuse     In past  . GERD (gastroesophageal reflux disease)   . SOB (shortness of breath)     sometimes at night  . PUD (peptic ulcer disease)     with bleeding ulcers and bowel obstruction in 1994 and 1995  . DDD (  degenerative disc disease), cervical   . DDD (degenerative disc disease), lumbosacral   . Hyperlipidemia   . Stroke     cerebellar CVA  .  Shingles   . Asthmatic bronchitis   . Hypertension     No past surgical history on file.  Family History  Problem Relation Age of Onset  . Heart disease Father   . Heart failure Father   . Lung cancer Sister     Social History Social History  Substance Use Topics  . Smoking status: Current Every Day Smoker -- 0.25 packs/day    Types: Cigarettes  . Smokeless tobacco: Never Used     Comment: Pt smokes 5-6 cigarettes a day  . Alcohol Use: No    No Known Allergies  Current Outpatient Prescriptions  Medication Sig Dispense Refill  . carvedilol (COREG) 6.25 MG tablet Take 1 tablet (6.25 mg total) by mouth 2 (two) times daily with a meal. 60 tablet 1  . enoxaparin (LOVENOX) 30 MG/0.3ML injection Inject 0.9 mLs (90 mg total) into the skin daily. 30 Syringe 1  . feeding supplement (BOOST / RESOURCE BREEZE) LIQD Take 1 Container by mouth 2 (two) times daily between meals. (Patient taking differently: Take 1 Container by mouth 2 (two) times daily between meals. Ensure-chocolate 3 daily) 20 Container 0  . levofloxacin (LEVAQUIN) 750 MG tablet Take 1 tablet (750 mg total) by mouth daily. 3 tablet 0  . lisinopril (PRINIVIL,ZESTRIL) 10 MG tablet Take 1 tablet (10 mg total) by mouth daily. 30 tablet 1  . omeprazole (PRILOSEC) 40 MG capsule Take 1 capsule (40 mg total) by mouth daily. 30 capsule 3  . senna-docusate (SENOKOT-S) 8.6-50 MG per tablet Take 1 tablet by mouth at bedtime as needed for mild constipation. 30 tablet 0  . promethazine (PHENERGAN) 12.5 MG tablet Take 1 tablet (12.5 mg total) by mouth every 6 (six) hours as needed for nausea. (Patient not taking: Reported on 07/28/2015) 20 tablet 0   No current facility-administered medications for this visit.    Review of Systems  Constitutional: positive for anorexia, fatigue and weight loss Eyes: negative Ears, nose, mouth, throat, and face: negative Respiratory: positive for cough, dyspnea on exertion, pleurisy/chest pain and  sputum Cardiovascular: negative Gastrointestinal: negative Genitourinary:negative Integument/breast: negative Hematologic/lymphatic: negative Musculoskeletal:positive for muscle weakness Neurological: negative Behavioral/Psych: negative Endocrine: negative Allergic/Immunologic: negative  Physical Exam  LKG:MWNUU, healthy, no distress, well developed and malnourished SKIN: skin color, texture, turgor are normal, no rashes or significant lesions HEAD: Normocephalic, No masses, lesions, tenderness or abnormalities EYES: normal, PERRLA, Conjunctiva are pink and non-injected EARS: External ears normal, Canals clear OROPHARYNX:no exudate, no erythema and lips, buccal mucosa, and tongue normal  NECK: supple, no adenopathy, no JVD LYMPH:  no palpable lymphadenopathy, no hepatosplenomegaly LUNGS: decreased breath sounds, expiratory wheezes bilaterally HEART: regular rate & rhythm ABDOMEN:abdomen soft, non-tender, normal bowel sounds and no masses or organomegaly BACK: Back symmetric, no curvature., No CVA tenderness EXTREMITIES:no joint deformities, effusion, or inflammation, no edema, no skin discoloration  NEURO: alert & oriented x 3 with fluent speech, no focal motor/sensory deficits  PERFORMANCE STATUS: ECOG 1-2  LABORATORY DATA: Lab Results  Component Value Date   WBC 9.4 07/20/2015   HGB 8.5* 07/20/2015   HCT 25.8* 07/20/2015   MCV 80.9 07/20/2015   PLT 325 07/20/2015      Chemistry      Component Value Date/Time   NA 134* 07/20/2015 0335   K 3.7 07/20/2015 0335   CL 104 07/20/2015  0335   CO2 23 07/20/2015 0335   BUN 13 07/20/2015 0335   CREATININE 0.75 07/20/2015 0335   CREATININE 0.73 07/13/2015 1013      Component Value Date/Time   CALCIUM 8.0* 07/20/2015 0335   ALKPHOS 64 07/19/2015 0310   AST 18 07/19/2015 0310   ALT 14* 07/19/2015 0310   BILITOT 0.7 07/19/2015 0310       RADIOGRAPHIC STUDIES: Dg Chest 2 View  07/13/2015   CLINICAL DATA:  Abdominal  pain.  Unintentional weight loss.  EXAM: CHEST - 2 VIEW  COMPARISON:  None.  FINDINGS: Heart size is normal. Lungs are hyperexpanded, suggesting emphysema. Nodular density over the right chest likely represents a nipple shadow additional nodule is seen in the right upper lobe measuring at 2.3 cm maximally. The left lung is clear. Atherosclerotic calcifications are present at the aortic arch. The visualized soft tissues and bony thorax are unremarkable.  IMPRESSION: 1. 2 cm nodular density at the right upper lobe. This is concerning for a primary lung neoplasm. Recommend CT of the chest with contrast for further evaluation. 2. Additional lower nodular density likely represents a nipple shadow but can also be evaluated by CT. 3. Emphysema. These results will be called to the ordering clinician or representative by the Radiologist Assistant, and communication documented in the PACS or zVision Dashboard.   Electronically Signed   By: San Morelle M.D.   On: 07/13/2015 10:12   Ct Chest W Contrast  07/18/2015   ADDENDUM REPORT: 07/18/2015 10:21  ADDENDUM: Critical Value/emergent results were called by telephone at the time of interpretation on 07/18/2015 at 10:21 am to Dr. Lynne Leader , who verbally acknowledged these results.   Electronically Signed   By: Franchot Gallo M.D.   On: 07/18/2015 10:21   07/18/2015   CLINICAL DATA:  Lung nodule.  Weight loss.  EXAM: CT CHEST WITH CONTRAST  TECHNIQUE: Multidetector CT imaging of the chest was performed during intravenous contrast administration.  CONTRAST:  53m OMNIPAQUE IOHEXOL 300 MG/ML  SOLN  COMPARISON:  Chest x-ray 07/13/2015  FINDINGS: Right upper lobe nodule measures 17 x 20 mm. Soft tissue density without calcification.  Mediastinal adenopathy. Right peritracheal lymph node measures 27 mm. Left lower peritracheal lymph node 15 mm. Right upper paratracheal lymph nodes measuring approximately 15 mm. Enlarged lymph nodes in the lower neck bilaterally. These lymph  nodes are consistent with metastatic disease. Findings are most consistent with carcinoma of the lung. Subcentimeter lymph nodes right hilum, possible tumor involvement.  Filling defect right lower lobe pulmonary artery in the medial base with branching in the smaller vessels, consistent with pulmonary embolism. No other filling defects identified.  Patchy airspace disease in the right posterior lung base and in the right posterior lung apex. Small right effusion. These findings are most likely related to pneumonia with tumor considered less likely. Left lung is clear.  Apical scarring bilaterally.  No skeletal lesions.  Moderate pericardial effusion. Malignancy is possible in the pericardium. Coronary artery calcification.  Upper abdomen reveals no mass lesion.  IMPRESSION: 17 x 20 mm right upper lobe nodule. There is malignant appearing adenopathy in the mediastinum and lower neck, consistent with metastatic disease. 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  Pericardial effusion, possibly due to metastatic disease.  Right upper lobe infiltrate and right lower lobe infiltrate, suspicious for pneumonia. Small right effusion.  Right lower lobe isolated pulmonary embolism.  Electronically Signed: By: CFranchot GalloM.D. On: 07/18/2015 10:15   Ct Abdomen Pelvis W Contrast  07/19/2015   CLINICAL DATA:  Right lung mass, probable lung cancer. Staging. Left leg swelling. Pulmonary embolism.  EXAM: CT ABDOMEN AND PELVIS WITH CONTRAST  TECHNIQUE: Multidetector CT imaging of the abdomen and pelvis was performed using the standard protocol following bolus administration of intravenous contrast.  CONTRAST:  116m OMNIPAQUE IOHEXOL 300 MG/ML  SOLN  COMPARISON:  CT chest 07/18/2015  FINDINGS: Lower chest: Small right pleural effusion with compressive atelectasis right lower lobe. Left lung is clear. Small pericardial effusion, slightly improved from yesterday.  Hepatobiliary: Ill-defined hypodensity adjacent to the falciform ligament without  mass-effect. This fills in on delayed imaging and most likely is an area of transient perfusion defect or hepatic steatosis. Heterogeneous liver diffusely without additional focal mass lesion, likely due to timing of injection. No definite liver metastasis. Gallbladder and bile ducts normal.  Pancreas: Negative  Spleen: Negative  Adrenals/Urinary Tract: Large parapelvic cyst on the left measuring 7.4 x 4.3 cm. Negative for hydronephrosis. Early excretion of contrast on the initial phase. No renal calculi. Urinary bladder normal.  Stomach/Bowel: Negative for bowel obstruction.  No bowel thickening.  Vascular/Lymphatic: Atherosclerotic calcification aorta and iliac vessels. Probable occlusion right femoral artery.  Thrombus in the left femoral vein and iliac vein compatible with DVT. Small pulmonary embolism noted on yesterday's CT of the chest.  Reproductive: Prostate enlargement with prostate calcifications.  Other: Small amount of free fluid in the pelvis.  Musculoskeletal: Negative for fracture. Negative for skeletal metastasis. Mild lumbar disc degeneration.  IMPRESSION: Heterogeneous liver but no definite liver metastasis. Imaging findings are likely due to timing of the scan versus hepatic steatosis  Pericardial effusion which may be due to metastatic disease. This is slightly improved from yesterday. Small right pleural effusion  DVT left femoral and iliac vein.  Apparent occlusion right femoral artery.  Small amount of free fluid in the pelvis. No mass or adenopathy in the abdomen or pelvis.   Electronically Signed   By: CFranchot GalloM.D.   On: 07/19/2015 10:33   [place]  07/20/2015   ADDENDUM REPORT: 07/20/2015 17:07  ADDENDUM: EXAM:  [place]OP[place] CORE LYMPH NODE   Electronically Signed   By: HJacqulynn CadetM.D.   On: 07/20/2015 17:07   07/20/2015   CLINICAL DATA:  74year old male with newly diagnosed widespread metastatic disease and right lung mass concerning for primary lung cancer.  Ultrasound-guided core biopsy of right supraclavicular adenopathy for tissue diagnosis.  EXAM: ULTRASOUND BIOPSY CORE LIVER  Date: 07/19/2015  PROCEDURE: 1. Ultrasound-guided core biopsy right supraclavicular node Interventional Radiologist:  HCriselda Peaches MD  ANESTHESIA/SEDATION: 1 mg Versed said administered for anxiolysis  MEDICATIONS: None required  TECHNIQUE: Informed consent was obtained from the patient following explanation of the procedure, risks, benefits and alternatives. The patient understands, agrees and consents for the procedure. All questions were addressed. A time out was performed.  The right supraclavicular fossa was interrogated with ultrasound. There are multiple enlarged hypoechoic lymph nodes. A suitable skin entry site was selected and marked. The region was sterilely prepped and draped in standard fashion using Betadine skin prep. Local anesthesia was attained by infiltration with 1% lidocaine. A small dermatotomy was made.  Under real-time sonographic guidance, multiple 18 gauge core biopsies were obtained using a [employer] automated biopsy device. Biopsy specimens were placed in saline and delivered to pathology for further analysis.  Hemostasis was attained with gentle manual pressure. The patient tolerated the procedure well.  COMPLICATIONS: None  Estimated blood loss:  0  IMPRESSION: Technically successful ultrasound-guided core biopsy of a right supraclavicular node.  Signed,  Criselda Peaches, MD  [facility]  [facility]  Electronically Signed: By: Jacqulynn Cadet M.D. On: 07/19/2015 14:34   Dg Abd 2 Views  07/13/2015   CLINICAL DATA:  Abdominal pain with unintentional weight loss  EXAM: ABDOMEN - 2 VIEW  COMPARISON:  None.  FINDINGS: Supine and upright images obtained. There is diffuse stool throughout the colon and rectum. There is no bowel dilatation or air-fluid level suggesting obstruction. No free air. Lung bases  are clear. There are surgical clips in the right upper quadrant region. There are vascular calcifications in the pelvis.  IMPRESSION: Diffuse stool throughout colon and rectum. No bowel obstruction. No free air. Lung bases clear. Nipple shadows noted incidentally bilaterally.   Electronically Signed   By: Lowella Grip III M.D.   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   PLAN: I had a lengthy discussion with the patient and his family today about his current disease stage, prognosis and treatment options. I recommended for the patient to complete the staging workup by ordering a PET scan as well as MRI of the brain. I will also refer the patient to gastroenterology for evaluation and consideration of upper endoscopy to rule out any primary upper gastrointestinal malignancy. For the malnutrition I then referred the patient today to the dietitian at the [facility] for evaluation of his condition. I will arrange for the patient to come back for follow-up visit in 2 weeks for reevaluation and more detailed discussion of his treatment options based on the final staging workup and gastrointestinal evaluation. I discussed with the patient his option for treatment including palliative care and hospice referral versus consideration of systemic chemotherapy if needed. He is interested in treatment. The regimen would be discussed with him in more details during his upcoming visit after completing the staging workup. The patient was advised to call immediately if he has any concerning symptoms in the interval. The patient voices understanding of current disease status and treatment options and is in agreement with the current care plan.  All questions were  answered. The patient knows to call the clinic with any problems, questions or concerns. We can certainly see the patient much sooner if necessary.  Thank you so much for allowing me to participate in the care of [employer]. I will continue to follow up the patient with you and assist in his care.  I spent 40 minutes counseling the patient face to face. The total time spent in the appointment was 60 minutes.  Disclaimer: This note was dictated with voice recognition software. Similar sounding words can inadvertently be transcribed and may not be corrected upon review.   Luci Bellucci K. July 28, 2015, 11:06 AM

## 2015-07-29 ENCOUNTER — Encounter: Payer: Self-pay | Admitting: Internal Medicine

## 2015-08-03 ENCOUNTER — Telehealth: Payer: Self-pay | Admitting: Internal Medicine

## 2015-08-03 ENCOUNTER — Encounter: Payer: Self-pay | Admitting: Internal Medicine

## 2015-08-03 ENCOUNTER — Ambulatory Visit (HOSPITAL_BASED_OUTPATIENT_CLINIC_OR_DEPARTMENT_OTHER): Payer: PPO | Admitting: Internal Medicine

## 2015-08-03 ENCOUNTER — Ambulatory Visit: Payer: PPO

## 2015-08-03 VITALS — BP 145/93 | HR 88 | Temp 98.7°F | Resp 18 | Ht 72.0 in | Wt 141.5 lb

## 2015-08-03 DIAGNOSIS — C3491 Malignant neoplasm of unspecified part of right bronchus or lung: Secondary | ICD-10-CM

## 2015-08-03 DIAGNOSIS — Z86711 Personal history of pulmonary embolism: Secondary | ICD-10-CM

## 2015-08-03 DIAGNOSIS — C801 Malignant (primary) neoplasm, unspecified: Secondary | ICD-10-CM

## 2015-08-03 DIAGNOSIS — E46 Unspecified protein-calorie malnutrition: Secondary | ICD-10-CM | POA: Diagnosis not present

## 2015-08-03 MED ORDER — ENOXAPARIN SODIUM 100 MG/ML ~~LOC~~ SOLN: 100.0000 mg | SUBCUTANEOUS | Status: DC

## 2015-08-03 NOTE — Progress Notes (Signed)
[place] [phone]   [phone]  OFFICE PROGRESS NOTE  Scarlette Calico, MD [address] [facility] [address] [place] [address]  DIAGNOSIS: stage IV (T1a, N3, M1 1A) non-small cell lung cancer, poorly differentiated adenocarcinoma with signet ring appearance and concern about upper gastrointestinal etiology as well, diagnosed in August 2016.  PRIOR THERAPY: None  CURRENT THERAPY: None  INTERVAL HISTORY: Jared Rogers 74 y.o. male returns to the clinic today for follow-up visit accompanied by his son and brother-in-law. The patient continues to complain of increasing fatigue and weakness. He also has lack of appetite and weight loss. He was referred to the dietitian at the [facility] for evaluation of his persistent weight loss. He was referred recently to gastroenterology for evaluation and consideration of upper endoscopy to rule out upper gastrointestinal primary. His appointment is scheduled for 08/18/2015. The patient also has order for a PET scan as well as MRI of the brain. These are is scheduled for 08/09/2015. He denied having any other significant complaints. He has no chest pain but has shortness of breath with exertion was no cough or hemoptysis. He has no nausea or vomiting, no fever or chills.  MEDICAL HISTORY: Past Medical History  Diagnosis Date  . Depression   . Hx of migraines   . Alcohol abuse     In past  . GERD (gastroesophageal reflux disease)   . SOB (shortness of breath)     sometimes at night  . PUD (peptic ulcer disease)     with bleeding ulcers and bowel obstruction in 1994 and 1995  . DDD (degenerative disc disease), cervical   . DDD (degenerative disc disease), lumbosacral   . Hyperlipidemia   . Stroke     cerebellar CVA  . Shingles   . Asthmatic bronchitis   . Hypertension     ALLERGIES:  has No Known Allergies.  MEDICATIONS:  Current Outpatient Prescriptions  Medication Sig Dispense Refill  .  carvedilol (COREG) 6.25 MG tablet Take 1 tablet (6.25 mg total) by mouth 2 (two) times daily with a meal. 60 tablet 1  . enoxaparin (LOVENOX) 30 MG/0.3ML injection Inject 0.9 mLs (90 mg total) into the skin daily. 30 Syringe 1  . feeding supplement (BOOST / RESOURCE BREEZE) LIQD Take 1 Container by mouth 2 (two) times daily between meals. (Patient taking differently: Take 1 Container by mouth 2 (two) times daily between meals. Ensure-chocolate 3 daily) 20 Container 0  . levofloxacin (LEVAQUIN) 750 MG tablet Take 1 tablet (750 mg total) by mouth daily. 3 tablet 0  . lisinopril (PRINIVIL,ZESTRIL) 10 MG tablet Take 1 tablet (10 mg total) by mouth daily. 30 tablet 1  . omeprazole (PRILOSEC) 40 MG capsule Take 1 capsule (40 mg total) by mouth daily. 30 capsule 3  . senna-docusate (SENOKOT-S) 8.6-50 MG per tablet Take 1 tablet by mouth at bedtime as needed for mild constipation. 30 tablet 0  . promethazine (PHENERGAN) 12.5 MG tablet Take 1 tablet (12.5 mg total) by mouth every 6 (six) hours as needed for nausea. (Patient not taking: Reported on 07/28/2015) 20 tablet 0   No current facility-administered medications for this visit.    SURGICAL HISTORY: History reviewed. No pertinent past surgical history.  REVIEW OF SYSTEMS:  A comprehensive review of systems was negative except for: Constitutional: positive for anorexia, fatigue and weight loss Musculoskeletal: positive for back pain and muscle weakness   PHYSICAL EXAMINATION: General appearance: alert, cooperative, fatigued and  no distress Head: Normocephalic, without obvious abnormality, atraumatic Neck: no adenopathy, no JVD, supple, symmetrical, trachea midline and thyroid not enlarged, symmetric, no tenderness/mass/nodules Lymph nodes: Cervical, supraclavicular, and axillary nodes normal. Resp: clear to auscultation bilaterally Back: symmetric, no curvature. ROM normal. No CVA tenderness. Cardio: regular rate and rhythm, S1, S2 normal, no murmur,  click, rub or gallop GI: soft, non-tender; bowel sounds normal; no masses,  no organomegaly Extremities: extremities normal, atraumatic, no cyanosis or edema  ECOG PERFORMANCE STATUS: 2 - Symptomatic, <50% confined to bed  Blood pressure 145/93, pulse 88, temperature 98.7 F (37.1 C), temperature source Oral, resp. rate 18, height 6' (1.829 m), weight 141 lb 8 oz (64.184 kg), SpO2 100 %.  LABORATORY DATA: Lab Results  Component Value Date   WBC 9.4 07/20/2015   HGB 8.5* 07/20/2015   HCT 25.8* 07/20/2015   MCV 80.9 07/20/2015   PLT 325 07/20/2015      Chemistry      Component Value Date/Time   NA 134* 07/20/2015 0335   K 3.7 07/20/2015 0335   CL 104 07/20/2015 0335   CO2 23 07/20/2015 0335   BUN 13 07/20/2015 0335   CREATININE 0.75 07/20/2015 0335   CREATININE 0.73 07/13/2015 1013      Component Value Date/Time   CALCIUM 8.0* 07/20/2015 0335   ALKPHOS 64 07/19/2015 0310   AST 18 07/19/2015 0310   ALT 14* 07/19/2015 0310   BILITOT 0.7 07/19/2015 0310       RADIOGRAPHIC STUDIES: Dg Chest 2 View  07/13/2015   CLINICAL DATA:  Abdominal pain.  Unintentional weight loss.  EXAM: CHEST - 2 VIEW  COMPARISON:  None.  FINDINGS: Heart size is normal. Lungs are hyperexpanded, suggesting emphysema. Nodular density over the right chest likely represents a nipple shadow additional nodule is seen in the right upper lobe measuring at 2.3 cm maximally. The left lung is clear. Atherosclerotic calcifications are present at the aortic arch. The visualized soft tissues and bony thorax are unremarkable.  IMPRESSION: 1. 2 cm nodular density at the right upper lobe. This is concerning for a primary lung neoplasm. Recommend CT of the chest with contrast for further evaluation. 2. Additional lower nodular density likely represents a nipple shadow but can also be evaluated by CT. 3. Emphysema. These results will be called to the ordering clinician or representative by the Radiologist Assistant, and  communication documented in the PACS or zVision Dashboard.   Electronically Signed   By: San Morelle M.D.   On: 07/13/2015 10:12   Ct Chest W Contrast  07/18/2015   ADDENDUM REPORT: 07/18/2015 10:21  ADDENDUM: Critical Value/emergent results were called by telephone at the time of interpretation on 07/18/2015 at 10:21 am to Dr. Lynne Leader , who verbally acknowledged these results.   Electronically Signed   By: Franchot Gallo M.D.   On: 07/18/2015 10:21   07/18/2015   CLINICAL DATA:  Lung nodule.  Weight loss.  EXAM: CT CHEST WITH CONTRAST  TECHNIQUE: Multidetector CT imaging of the chest was performed during intravenous contrast administration.  CONTRAST:  36m OMNIPAQUE IOHEXOL 300 MG/ML  SOLN  COMPARISON:  Chest x-ray 07/13/2015  FINDINGS: Right upper lobe nodule measures 17 x 20 mm. Soft tissue density without calcification.  Mediastinal adenopathy. Right peritracheal lymph node measures 27 mm. Left lower peritracheal lymph node 15 mm. Right upper paratracheal lymph nodes measuring approximately 15 mm. Enlarged lymph nodes in the lower neck bilaterally. These lymph nodes are consistent with metastatic disease. Findings  are most consistent with carcinoma of the lung. Subcentimeter lymph nodes right hilum, possible tumor involvement.  Filling defect right lower lobe pulmonary artery in the medial base with branching in the smaller vessels, consistent with pulmonary embolism. No other filling defects identified.  Patchy airspace disease in the right posterior lung base and in the right posterior lung apex. Small right effusion. These findings are most likely related to pneumonia with tumor considered less likely. Left lung is clear.  Apical scarring bilaterally.  No skeletal lesions.  Moderate pericardial effusion. Malignancy is possible in the pericardium. Coronary artery calcification.  Upper abdomen reveals no mass lesion.  IMPRESSION: 17 x 20 mm right upper lobe nodule. There is malignant appearing  adenopathy in the mediastinum and lower neck, consistent with metastatic disease. 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  Pericardial effusion, possibly due to metastatic disease.  Right upper lobe infiltrate and right lower lobe infiltrate, suspicious for pneumonia. Small right effusion.  Right lower lobe isolated pulmonary embolism.  Electronically Signed: By: Franchot Gallo M.D. On: 07/18/2015 10:15   Ct Abdomen Pelvis W Contrast  07/19/2015   CLINICAL DATA:  Right lung mass, probable lung cancer. Staging. Left leg swelling. Pulmonary embolism.  EXAM: CT ABDOMEN AND PELVIS WITH CONTRAST  TECHNIQUE: Multidetector CT imaging of the abdomen and pelvis was performed using the standard protocol following bolus administration of intravenous contrast.  CONTRAST:  142m OMNIPAQUE IOHEXOL 300 MG/ML  SOLN  COMPARISON:  CT chest 07/18/2015  FINDINGS: Lower chest: Small right pleural effusion with compressive atelectasis right lower lobe. Left lung is clear. Small pericardial effusion, slightly improved from yesterday.  Hepatobiliary: Ill-defined hypodensity adjacent to the falciform ligament without mass-effect. This fills in on delayed imaging and most likely is an area of transient perfusion defect or hepatic steatosis. Heterogeneous liver diffusely without additional focal mass lesion, likely due to timing of injection. No definite liver metastasis. Gallbladder and bile ducts normal.  Pancreas: Negative  Spleen: Negative  Adrenals/Urinary Tract: Large parapelvic cyst on the left measuring 7.4 x 4.3 cm. Negative for hydronephrosis. Early excretion of contrast on the initial phase. No renal calculi. Urinary bladder normal.  Stomach/Bowel: Negative for bowel obstruction.  No bowel thickening.  Vascular/Lymphatic: Atherosclerotic calcification aorta and iliac vessels. Probable occlusion right femoral artery.  Thrombus in the left femoral vein and iliac vein compatible with DVT. Small pulmonary embolism noted on yesterday's CT of the  chest.  Reproductive: Prostate enlargement with prostate calcifications.  Other: Small amount of free fluid in the pelvis.  Musculoskeletal: Negative for fracture. Negative for skeletal metastasis. Mild lumbar disc degeneration.  IMPRESSION: Heterogeneous liver but no definite liver metastasis. Imaging findings are likely due to timing of the scan versus hepatic steatosis  Pericardial effusion which may be due to metastatic disease. This is slightly improved from yesterday. Small right pleural effusion  DVT left femoral and iliac vein.  Apparent occlusion right femoral artery.  Small amount of free fluid in the pelvis. No mass or adenopathy in the abdomen or pelvis.   Electronically Signed   By: CFranchot GalloM.D.   On: 07/19/2015 10:33   [place]  07/20/2015   ADDENDUM REPORT: 07/20/2015 17:07  ADDENDUM: EXAM:  [place] CORE LYMPH NODE   Electronically Signed   By: HJacqulynn CadetM.D.   On: 07/20/2015 17:07   07/20/2015   CLINICAL DATA:  74year old male with newly diagnosed widespread metastatic disease and right lung mass concerning for primary lung cancer. Ultrasound-guided core biopsy of right supraclavicular  adenopathy for tissue diagnosis.  EXAM: ULTRASOUND BIOPSY CORE LIVER  Date: 07/19/2015  PROCEDURE: 1. Ultrasound-guided core biopsy right supraclavicular node Interventional Radiologist:  Criselda Peaches, MD  ANESTHESIA/SEDATION: 1 mg Versed said administered for anxiolysis  MEDICATIONS: None required  TECHNIQUE: Informed consent was obtained from the patient following explanation of the procedure, risks, benefits and alternatives. The patient understands, agrees and consents for the procedure. All questions were addressed. A time out was performed.  The right supraclavicular fossa was interrogated with ultrasound. There are multiple enlarged hypoechoic lymph nodes. A suitable skin entry site was selected and marked. The region was sterilely prepped and draped in standard fashion using Betadine  skin prep. Local anesthesia was attained by infiltration with 1% lidocaine. A small dermatotomy was made.  Under real-time sonographic guidance, multiple 18 gauge core biopsies were obtained using a [employer] automated biopsy device. Biopsy specimens were placed in saline and delivered to pathology for further analysis.  Hemostasis was attained with gentle manual pressure. The patient tolerated the procedure well.  COMPLICATIONS: None  Estimated blood loss:  0  IMPRESSION: Technically successful ultrasound-guided core biopsy of a right supraclavicular node.  Signed,  Criselda Peaches, MD  [facility]  [facility]  Electronically Signed: By: Jacqulynn Cadet M.D. On: 07/19/2015 14:34   Dg Abd 2 Views  07/13/2015   CLINICAL DATA:  Abdominal pain with unintentional weight loss  EXAM: ABDOMEN - 2 VIEW  COMPARISON:  None.  FINDINGS: Supine and upright images obtained. There is diffuse stool throughout the colon and rectum. There is no bowel dilatation or air-fluid level suggesting obstruction. No free air. Lung bases are clear. There are surgical clips in the right upper quadrant region. There are vascular calcifications in the pelvis.  IMPRESSION: Diffuse stool throughout colon and rectum. No bowel obstruction. No free air. Lung bases clear. Nipple shadows noted incidentally bilaterally.   Electronically Signed   By: Lowella Grip III M.D.   On: 07/13/2015 10:11    ASSESSMENT AND PLAN: This is a very pleasant 74 years old male with stage IV adenocarcinoma of likely lung primary but upper gastrointestinal cannot be ruled out at this point. The patient has an appointment with gastroenterology for evaluation and upper endoscopy to rule out upper gastrointestinal primary but this is schedule early September 2016. The patient also has a PET scan and MRI of the brain scheduled to be performed next week. I recommended for him to come back for follow-up visit in  one week for reevaluation and discussion of his scan results and further recommendation regarding treatment of his condition based on the imaging studies. For the pulmonary embolism, the patient will continue on Lovenox 100 mg subcutaneously daily and he was given a refill of this medication. The patient was advised to call immediately if he has any concerning symptoms in the interval.  The patient voices understanding of current disease status and treatment options and is in agreement with the current care plan.  All questions were answered. The patient knows to call the clinic with any problems, questions or concerns. We can certainly see the patient much sooner if necessary.  Disclaimer: This note was dictated with voice recognition software. Similar sounding words can inadvertently be transcribed and may not be corrected upon review.

## 2015-08-03 NOTE — Telephone Encounter (Signed)
appointments made and avs printed for patient °

## 2015-08-04 ENCOUNTER — Ambulatory Visit: Payer: PPO | Admitting: Gastroenterology

## 2015-08-04 DIAGNOSIS — C3490 Malignant neoplasm of unspecified part of unspecified bronchus or lung: Secondary | ICD-10-CM | POA: Diagnosis not present

## 2015-08-07 ENCOUNTER — Telehealth: Payer: Self-pay | Admitting: Nutrition

## 2015-08-07 ENCOUNTER — Encounter: Payer: PPO | Admitting: Nutrition

## 2015-08-07 ENCOUNTER — Inpatient Hospital Stay: Payer: PPO | Admitting: Internal Medicine

## 2015-08-07 NOTE — Telephone Encounter (Signed)
Jared Rogers cancelled patient appointment secondary to "an emergency". She asked to be contacted to reschedule. I called and left message for her to contact schedulers for rescheduling appointment.

## 2015-08-08 ENCOUNTER — Encounter: Payer: Self-pay | Admitting: Internal Medicine

## 2015-08-08 ENCOUNTER — Ambulatory Visit (INDEPENDENT_AMBULATORY_CARE_PROVIDER_SITE_OTHER): Payer: PPO | Admitting: Internal Medicine

## 2015-08-08 ENCOUNTER — Other Ambulatory Visit: Payer: Self-pay | Admitting: Internal Medicine

## 2015-08-08 ENCOUNTER — Telehealth: Payer: Self-pay | Admitting: Internal Medicine

## 2015-08-08 VITALS — BP 120/68 | HR 76 | Temp 97.6°F | Resp 16 | Ht 72.0 in | Wt 139.0 lb

## 2015-08-08 DIAGNOSIS — I2699 Other pulmonary embolism without acute cor pulmonale: Secondary | ICD-10-CM

## 2015-08-08 DIAGNOSIS — D509 Iron deficiency anemia, unspecified: Secondary | ICD-10-CM | POA: Diagnosis not present

## 2015-08-08 DIAGNOSIS — F0391 Unspecified dementia with behavioral disturbance: Secondary | ICD-10-CM | POA: Diagnosis not present

## 2015-08-08 DIAGNOSIS — Z23 Encounter for immunization: Secondary | ICD-10-CM

## 2015-08-08 DIAGNOSIS — C3490 Malignant neoplasm of unspecified part of unspecified bronchus or lung: Secondary | ICD-10-CM

## 2015-08-08 DIAGNOSIS — F03918 Unspecified dementia, unspecified severity, with other behavioral disturbance: Secondary | ICD-10-CM

## 2015-08-08 DIAGNOSIS — C3491 Malignant neoplasm of unspecified part of right bronchus or lung: Secondary | ICD-10-CM

## 2015-08-08 MED ORDER — FERIVA 21/7 75-1 MG PO TABS: 1.0000 | ORAL_TABLET | Freq: Every day | ORAL | Status: DC

## 2015-08-08 NOTE — Progress Notes (Signed)
Pre visit review using our clinic review tool, if applicable. No additional management support is needed unless otherwise documented below in the visit note. 

## 2015-08-08 NOTE — Addendum Note (Signed)
Addended by: Levonne Lapping on: 08/08/2015 05:13 PM   Modules accepted: Orders

## 2015-08-08 NOTE — Patient Instructions (Signed)
Iron Deficiency Anemia Anemia is a condition in which there are less red blood cells or hemoglobin in the blood than normal. Hemoglobin is the part of red blood cells that carries oxygen. Iron deficiency anemia is anemia caused by too little iron. It is the most common type of anemia. It may leave you tired and short of breath. CAUSES   Lack of iron in the diet.  Poor absorption of iron, as seen with intestinal disorders.  Intestinal bleeding.  Heavy periods. SIGNS AND SYMPTOMS  Mild anemia may not be noticeable. Symptoms may include:  Fatigue.  Headache.  Pale skin.  Weakness.  Tiredness.  Shortness of breath.  Dizziness.  Cold hands and feet.  Fast or irregular heartbeat. DIAGNOSIS  Diagnosis requires a thorough evaluation and physical exam by your health care provider. Blood tests are generally used to confirm iron deficiency anemia. Additional tests may be done to find the underlying cause of your anemia. These may include:  Testing for blood in the stool (fecal occult blood test).  A procedure to see inside the colon and rectum (colonoscopy).  A procedure to see inside the esophagus and stomach (endoscopy). TREATMENT  Iron deficiency anemia is treated by correcting the cause of the deficiency. Treatment may involve:  Adding iron-rich foods to your diet.  Taking iron supplements. Pregnant or breastfeeding women need to take extra iron because their normal diet usually does not provide the required amount.  Taking vitamins. Vitamin C improves the absorption of iron. Your health care provider may recommend that you take your iron tablets with a glass of orange juice or vitamin C supplement.  Medicines to make heavy menstrual flow lighter.  Surgery. HOME CARE INSTRUCTIONS   Take iron as directed by your health care provider.  If you cannot tolerate taking iron supplements by mouth, talk to your health care provider about taking them through a vein  (intravenously) or an injection into a muscle.  For the best iron absorption, iron supplements should be taken on an empty stomach. If you cannot tolerate them on an empty stomach, you may need to take them with food.  Do not drink milk or take antacids at the same time as your iron supplements. Milk and antacids may interfere with the absorption of iron.  Iron supplements can cause constipation. Make sure to include fiber in your diet to prevent constipation. A stool softener may also be recommended.  Take vitamins as directed by your health care provider.  Eat a diet rich in iron. Foods high in iron include liver, lean beef, whole-grain bread, eggs, dried fruit, and dark green leafy vegetables. SEEK IMMEDIATE MEDICAL CARE IF:   You faint. If this happens, do not drive. Call your local emergency services (911 in [place] if no other help is available.  You have chest pain.  You feel nauseous or vomit.  You have severe or increased shortness of breath with activity.  You feel weak.  You have a rapid heartbeat.  You have unexplained sweating.  You become light-headed when getting up from a chair or bed. MAKE SURE YOU:   Understand these instructions.  Will watch your condition.  Will get help right away if you are not doing well or get worse. Document Released: 11/29/2000 Document Revised: 12/07/2013 Document Reviewed: 08/09/2013 Exit[employer] Patient Information 2015 [employer]. This information is not intended to replace advice given to you by your health care provider. Make sure you discuss any questions you have with your health care provider.  

## 2015-08-08 NOTE — Telephone Encounter (Signed)
Scheduled in error MD visit cancelled per 08/18 POF, pt is already scheduled with ML/AJ

## 2015-08-08 NOTE — Progress Notes (Signed)
Subjective:  Patient ID: Jared Rogers, male    DOB: June 19, 1941  Age: 74 y.o. MRN: [id number]  CC: Anemia   HPI Jared Rogers presents for a hospital follow-up. He was recently admitted and found to have stage IV lung cancer, possible upper GI pathology and pulmonary embolus. He has seen oncology and is scheduled to have some more staging and scanning done later this week. He was also found to be anemic with a low iron level. He complains of fatigue, weakness, loss of appetite, weight loss, and low endurance.  Outpatient Prescriptions Prior to Visit  Medication Sig Dispense Refill  . carvedilol (COREG) 6.25 MG tablet Take 1 tablet (6.25 mg total) by mouth 2 (two) times daily with a meal. 60 tablet 1  . enoxaparin (LOVENOX) 100 MG/ML injection Inject 1 mL (100 mg total) into the skin daily. 30 Syringe 2  . feeding supplement (BOOST / RESOURCE BREEZE) LIQD Take 1 Container by mouth 2 (two) times daily between meals. (Patient taking differently: Take 1 Container by mouth 2 (two) times daily between meals. Ensure-chocolate 3 daily) 20 Container 0  . lisinopril (PRINIVIL,ZESTRIL) 10 MG tablet Take 1 tablet (10 mg total) by mouth daily. 30 tablet 1  . omeprazole (PRILOSEC) 40 MG capsule Take 1 capsule (40 mg total) by mouth daily. 30 capsule 3  . levofloxacin (LEVAQUIN) 750 MG tablet Take 1 tablet (750 mg total) by mouth daily. 3 tablet 0  . promethazine (PHENERGAN) 12.5 MG tablet Take 1 tablet (12.5 mg total) by mouth every 6 (six) hours as needed for nausea. (Patient not taking: Reported on 07/28/2015) 20 tablet 0  . senna-docusate (SENOKOT-S) 8.6-50 MG per tablet Take 1 tablet by mouth at bedtime as needed for mild constipation. 30 tablet 0   No facility-administered medications prior to visit.    ROS Review of Systems  Constitutional: Positive for activity change, appetite change, fatigue and unexpected weight change. Negative for fever, chills and diaphoresis.  HENT: Negative.   Negative for trouble swallowing and voice change.   Eyes: Negative.   Respiratory: Negative.  Negative for cough, choking, chest tightness, shortness of breath and stridor.   Cardiovascular: Negative.  Negative for chest pain, palpitations and leg swelling.  Gastrointestinal: Positive for constipation. Negative for nausea, vomiting, abdominal pain, diarrhea and blood in stool.  Endocrine: Negative.   Genitourinary: Negative.   Musculoskeletal: Negative.   Skin: Negative.  Negative for rash.  Allergic/Immunologic: Negative.   Neurological: Negative.   Hematological: Negative.   Psychiatric/Behavioral: Negative.     Objective:  BP 120/68 mmHg  Pulse 76  Temp(Src) 97.6 F (36.4 C) (Oral)  Resp 16  Ht 6' (1.829 m)  Wt 139 lb (63.05 kg)  BMI 18.85 kg/m2  SpO2 98%  BP Readings from Last 3 Encounters:  08/08/15 120/68  08/03/15 145/93  07/28/15 111/62    Wt Readings from Last 3 Encounters:  08/08/15 139 lb (63.05 kg)  08/03/15 141 lb 8 oz (64.184 kg)  07/28/15 138 lb (62.596 kg)    Physical Exam  Constitutional: He is oriented to person, place, and time. No distress.  HENT:  Mouth/Throat: Oropharynx is clear and moist. No oropharyngeal exudate.  Eyes: Conjunctivae are normal. Right eye exhibits no discharge. Left eye exhibits no discharge. No scleral icterus.  Neck: Normal range of motion. Neck supple. No JVD present. No tracheal deviation present. No thyromegaly present.  Cardiovascular: Normal rate, regular rhythm, normal heart sounds and intact distal pulses.  Exam reveals no  gallop and no friction rub.   No murmur heard. Pulmonary/Chest: Effort normal and breath sounds normal. No stridor. No respiratory distress. He has no wheezes. He has no rales. He exhibits no tenderness.  Abdominal: Soft. Bowel sounds are normal. He exhibits no distension and no mass. There is no tenderness. There is no rebound and no guarding.  Musculoskeletal: Normal range of motion. He exhibits no  edema or tenderness.  Lymphadenopathy:    He has no cervical adenopathy.  Neurological: He is alert and oriented to person, place, and time. He has normal reflexes. He displays no atrophy, no tremor and normal reflexes. No cranial nerve deficit. He exhibits abnormal muscle tone. He displays no seizure activity. Coordination abnormal.  He is diffusely weak but there are no focal neurological signs.  Skin: Skin is warm and dry. No rash noted. He is not diaphoretic. No erythema. No pallor.  Psychiatric: He has a normal mood and affect. Judgment and thought content normal. His mood appears not anxious. His affect is not angry, not blunt, not labile and not inappropriate. His speech is delayed and tangential. He is slowed and withdrawn. Cognition and memory are impaired. He does not exhibit a depressed mood. He exhibits abnormal recent memory and abnormal remote memory. He is inattentive.  Vitals reviewed.   Lab Results  Component Value Date   WBC 9.4 07/20/2015   HGB 8.5* 07/20/2015   HCT 25.8* 07/20/2015   PLT 325 07/20/2015   GLUCOSE 104* 07/20/2015   CHOL 140 07/13/2015   TRIG 71 07/13/2015   HDL 40 07/13/2015   LDLCALC 86 07/13/2015   ALT 14* 07/19/2015   AST 18 07/19/2015   NA 134* 07/20/2015   K 3.7 07/20/2015   CL 104 07/20/2015   CREATININE 0.75 07/20/2015   BUN 13 07/20/2015   CO2 23 07/20/2015   TSH 1.351 07/18/2015   PSA 0.72 02/14/2014   INR 1.78* 07/19/2015   HGBA1C 5.1 07/18/2015    Ct Chest W Contrast  07/18/2015   ADDENDUM REPORT: 07/18/2015 10:21  ADDENDUM: Critical Value/emergent results were called by telephone at the time of interpretation on 07/18/2015 at 10:21 am to Dr. Lynne Leader , who verbally acknowledged these results.   Electronically Signed   By: Franchot Gallo M.D.   On: 07/18/2015 10:21   07/18/2015   CLINICAL DATA:  Lung nodule.  Weight loss.  EXAM: CT CHEST WITH CONTRAST  TECHNIQUE: Multidetector CT imaging of the chest was performed during intravenous  contrast administration.  CONTRAST:  53m OMNIPAQUE IOHEXOL 300 MG/ML  SOLN  COMPARISON:  Chest x-ray 07/13/2015  FINDINGS: Right upper lobe nodule measures 17 x 20 mm. Soft tissue density without calcification.  Mediastinal adenopathy. Right peritracheal lymph node measures 27 mm. Left lower peritracheal lymph node 15 mm. Right upper paratracheal lymph nodes measuring approximately 15 mm. Enlarged lymph nodes in the lower neck bilaterally. These lymph nodes are consistent with metastatic disease. Findings are most consistent with carcinoma of the lung. Subcentimeter lymph nodes right hilum, possible tumor involvement.  Filling defect right lower lobe pulmonary artery in the medial base with branching in the smaller vessels, consistent with pulmonary embolism. No other filling defects identified.  Patchy airspace disease in the right posterior lung base and in the right posterior lung apex. Small right effusion. These findings are most likely related to pneumonia with tumor considered less likely. Left lung is clear.  Apical scarring bilaterally.  No skeletal lesions.  Moderate pericardial effusion. Malignancy is possible in  the pericardium. Coronary artery calcification.  Upper abdomen reveals no mass lesion.  IMPRESSION: 17 x 20 mm right upper lobe nodule. There is malignant appearing adenopathy in the mediastinum and lower neck, consistent with metastatic disease. 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  Pericardial effusion, possibly due to metastatic disease.  Right upper lobe infiltrate and right lower lobe infiltrate, suspicious for pneumonia. Small right effusion.  Right lower lobe isolated pulmonary embolism.  Electronically Signed: By: Franchot Gallo M.D. On: 07/18/2015 10:15   Ct Abdomen Pelvis W Contrast  07/19/2015   CLINICAL DATA:  Right lung mass, probable lung cancer. Staging. Left leg swelling. Pulmonary embolism.  EXAM: CT ABDOMEN AND PELVIS WITH CONTRAST  TECHNIQUE: Multidetector CT imaging of the abdomen and  pelvis was performed using the standard protocol following bolus administration of intravenous contrast.  CONTRAST:  124m OMNIPAQUE IOHEXOL 300 MG/ML  SOLN  COMPARISON:  CT chest 07/18/2015  FINDINGS: Lower chest: Small right pleural effusion with compressive atelectasis right lower lobe. Left lung is clear. Small pericardial effusion, slightly improved from yesterday.  Hepatobiliary: Ill-defined hypodensity adjacent to the falciform ligament without mass-effect. This fills in on delayed imaging and most likely is an area of transient perfusion defect or hepatic steatosis. Heterogeneous liver diffusely without additional focal mass lesion, likely due to timing of injection. No definite liver metastasis. Gallbladder and bile ducts normal.  Pancreas: Negative  Spleen: Negative  Adrenals/Urinary Tract: Large parapelvic cyst on the left measuring 7.4 x 4.3 cm. Negative for hydronephrosis. Early excretion of contrast on the initial phase. No renal calculi. Urinary bladder normal.  Stomach/Bowel: Negative for bowel obstruction.  No bowel thickening.  Vascular/Lymphatic: Atherosclerotic calcification aorta and iliac vessels. Probable occlusion right femoral artery.  Thrombus in the left femoral vein and iliac vein compatible with DVT. Small pulmonary embolism noted on yesterday's CT of the chest.  Reproductive: Prostate enlargement with prostate calcifications.  Other: Small amount of free fluid in the pelvis.  Musculoskeletal: Negative for fracture. Negative for skeletal metastasis. Mild lumbar disc degeneration.  IMPRESSION: Heterogeneous liver but no definite liver metastasis. Imaging findings are likely due to timing of the scan versus hepatic steatosis  Pericardial effusion which may be due to metastatic disease. This is slightly improved from yesterday. Small right pleural effusion  DVT left femoral and iliac vein.  Apparent occlusion right femoral artery.  Small amount of free fluid in the pelvis. No mass or  adenopathy in the abdomen or pelvis.   Electronically Signed   By: CFranchot GalloM.D.   On: 07/19/2015 10:33   [place]  07/20/2015   ADDENDUM REPORT: 07/20/2015 17:07  ADDENDUM: EXAM:  [place] CORE LYMPH NODE   Electronically Signed   By: HJacqulynn CadetM.D.   On: 07/20/2015 17:07   07/20/2015   CLINICAL DATA:  74year old male with newly diagnosed widespread metastatic disease and right lung mass concerning for primary lung cancer. Ultrasound-guided core biopsy of right supraclavicular adenopathy for tissue diagnosis.  EXAM: ULTRASOUND BIOPSY CORE LIVER  Date: 07/19/2015  PROCEDURE: 1. Ultrasound-guided core biopsy right supraclavicular node Interventional Radiologist:  HCriselda Peaches MD  ANESTHESIA/SEDATION: 1 mg Versed said administered for anxiolysis  MEDICATIONS: None required  TECHNIQUE: Informed consent was obtained from the patient following explanation of the procedure, risks, benefits and alternatives. The patient understands, agrees and consents for the procedure. All questions were addressed. A time out was performed.  The right supraclavicular fossa was interrogated with ultrasound. There are multiple enlarged hypoechoic lymph nodes. A suitable  skin entry site was selected and marked. The region was sterilely prepped and draped in standard fashion using Betadine skin prep. Local anesthesia was attained by infiltration with 1% lidocaine. A small dermatotomy was made.  Under real-time sonographic guidance, multiple 18 gauge core biopsies were obtained using a [employer] automated biopsy device. Biopsy specimens were placed in saline and delivered to pathology for further analysis.  Hemostasis was attained with gentle manual pressure. The patient tolerated the procedure well.  COMPLICATIONS: None  Estimated blood loss:  0  IMPRESSION: Technically successful ultrasound-guided core biopsy of a right supraclavicular node.  Signed,  Criselda Peaches, MD  [facility]  [facility]  Electronically Signed: By: Jacqulynn Cadet M.D. On: 07/19/2015 14:34    Assessment & Plan:   Jared Rogers was seen today for anemia.  Diagnoses and all orders for this visit:  Non-small cell carcinoma of lung, stage 4, right- he has not started treatment for this yet, the evaluation is still in progress. -     Cancel: DME Hospital bed -     DME Hospital bed -     DME Other see comment  Dementia with behavioral disturbance- I don't think he has a long enough life expectancy to benefit from treating his dementia  PE (pulmonary embolism)- continue Lovenox  Iron deficiency anemia- I will start iron replacement therapy. -     FeAsp-B12-FA-C-DSS-SuccAc-Zn (FERIVA 21/7) 75-1 MG TABS; Take 1 tablet by mouth daily.  I have discontinued [name] levofloxacin, promethazine, and senna-docusate. I am also having him start on [place] 21/7. Additionally, I am having him maintain his omeprazole, feeding supplement, lisinopril, carvedilol, and enoxaparin.  Meds ordered this encounter  Medications  . FeAsp-B12-FA-C-DSS-SuccAc-Zn (FERIVA 21/7) 75-1 MG TABS    Sig: Take 1 tablet by mouth daily.    Dispense:  28 tablet    Refill:  11     Follow-up: Return in about 3 months (around 11/08/2015).  Scarlette Calico, MD

## 2015-08-09 ENCOUNTER — Ambulatory Visit (HOSPITAL_COMMUNITY)
Admission: RE | Admit: 2015-08-09 | Discharge: 2015-08-09 | Disposition: A | Discharge status: Home / Self Care | Payer: PPO | Source: Ambulatory Visit | Attending: Internal Medicine | Admitting: Internal Medicine

## 2015-08-09 ENCOUNTER — Telehealth: Payer: Self-pay | Admitting: *Deleted

## 2015-08-09 ENCOUNTER — Other Ambulatory Visit: Payer: Self-pay

## 2015-08-09 ENCOUNTER — Inpatient Hospital Stay (HOSPITAL_COMMUNITY)
Admission: EM | Admit: 2015-08-09 | Discharge: 2015-08-11 | DRG: 064 | Disposition: A | Discharge status: Skilled Nursing Facility | Payer: PPO | Attending: Internal Medicine | Admitting: Internal Medicine

## 2015-08-09 ENCOUNTER — Encounter (HOSPITAL_COMMUNITY): Payer: Self-pay | Admitting: *Deleted

## 2015-08-09 DIAGNOSIS — C3491 Malignant neoplasm of unspecified part of right bronchus or lung: Secondary | ICD-10-CM

## 2015-08-09 DIAGNOSIS — C3490 Malignant neoplasm of unspecified part of unspecified bronchus or lung: Secondary | ICD-10-CM | POA: Diagnosis not present

## 2015-08-09 DIAGNOSIS — Z8249 Family history of ischemic heart disease and other diseases of the circulatory system: Secondary | ICD-10-CM

## 2015-08-09 DIAGNOSIS — Z86711 Personal history of pulmonary embolism: Secondary | ICD-10-CM

## 2015-08-09 DIAGNOSIS — D638 Anemia in other chronic diseases classified elsewhere: Secondary | ICD-10-CM | POA: Diagnosis present

## 2015-08-09 DIAGNOSIS — I639 Cerebral infarction, unspecified: Principal | ICD-10-CM | POA: Diagnosis present

## 2015-08-09 DIAGNOSIS — Z6821 Body mass index (BMI) 21.0-21.9, adult: Secondary | ICD-10-CM | POA: Diagnosis not present

## 2015-08-09 DIAGNOSIS — E785 Hyperlipidemia, unspecified: Secondary | ICD-10-CM | POA: Diagnosis present

## 2015-08-09 DIAGNOSIS — I2699 Other pulmonary embolism without acute cor pulmonale: Secondary | ICD-10-CM | POA: Diagnosis not present

## 2015-08-09 DIAGNOSIS — K279 Peptic ulcer, site unspecified, unspecified as acute or chronic, without hemorrhage or perforation: Secondary | ICD-10-CM | POA: Diagnosis present

## 2015-08-09 DIAGNOSIS — Z85118 Personal history of other malignant neoplasm of bronchus and lung: Secondary | ICD-10-CM | POA: Diagnosis not present

## 2015-08-09 DIAGNOSIS — Z8673 Personal history of transient ischemic attack (TIA), and cerebral infarction without residual deficits: Secondary | ICD-10-CM | POA: Diagnosis not present

## 2015-08-09 DIAGNOSIS — I638 Other cerebral infarction: Secondary | ICD-10-CM | POA: Diagnosis not present

## 2015-08-09 DIAGNOSIS — I6340 Cerebral infarction due to embolism of unspecified cerebral artery: Secondary | ICD-10-CM

## 2015-08-09 DIAGNOSIS — R7989 Other specified abnormal findings of blood chemistry: Secondary | ICD-10-CM

## 2015-08-09 DIAGNOSIS — K219 Gastro-esophageal reflux disease without esophagitis: Secondary | ICD-10-CM | POA: Diagnosis present

## 2015-08-09 DIAGNOSIS — R64 Cachexia: Secondary | ICD-10-CM | POA: Diagnosis present

## 2015-08-09 DIAGNOSIS — Z66 Do not resuscitate: Secondary | ICD-10-CM | POA: Diagnosis present

## 2015-08-09 DIAGNOSIS — F17210 Nicotine dependence, cigarettes, uncomplicated: Secondary | ICD-10-CM | POA: Diagnosis present

## 2015-08-09 DIAGNOSIS — R778 Other specified abnormalities of plasma proteins: Secondary | ICD-10-CM

## 2015-08-09 DIAGNOSIS — D509 Iron deficiency anemia, unspecified: Secondary | ICD-10-CM | POA: Diagnosis present

## 2015-08-09 DIAGNOSIS — F03918 Unspecified dementia, unspecified severity, with other behavioral disturbance: Secondary | ICD-10-CM | POA: Diagnosis present

## 2015-08-09 DIAGNOSIS — Z801 Family history of malignant neoplasm of trachea, bronchus and lung: Secondary | ICD-10-CM

## 2015-08-09 DIAGNOSIS — E43 Unspecified severe protein-calorie malnutrition: Secondary | ICD-10-CM | POA: Diagnosis present

## 2015-08-09 DIAGNOSIS — F0391 Unspecified dementia with behavioral disturbance: Secondary | ICD-10-CM | POA: Diagnosis present

## 2015-08-09 DIAGNOSIS — C3411 Malignant neoplasm of upper lobe, right bronchus or lung: Secondary | ICD-10-CM | POA: Diagnosis present

## 2015-08-09 DIAGNOSIS — I447 Left bundle-branch block, unspecified: Secondary | ICD-10-CM | POA: Diagnosis present

## 2015-08-09 DIAGNOSIS — I10 Essential (primary) hypertension: Secondary | ICD-10-CM | POA: Diagnosis present

## 2015-08-09 LAB — URINALYSIS, ROUTINE W REFLEX MICROSCOPIC
Glucose, UA: NEGATIVE mg/dL
HGB URINE DIPSTICK: NEGATIVE
KETONES UR: 15 mg/dL — AB
Leukocytes, UA: NEGATIVE
NITRITE: NEGATIVE
PROTEIN: 30 mg/dL — AB
Specific Gravity, Urine: 1.03 (ref 1.005–1.030)
UROBILINOGEN UA: 2 mg/dL — AB (ref 0.0–1.0)
pH: 6 (ref 5.0–8.0)

## 2015-08-09 LAB — CBC WITH DIFFERENTIAL/PLATELET
BASOS ABS: 0 10*3/uL (ref 0.0–0.1)
Basophils Relative: 0 % (ref 0–1)
EOS PCT: 1 % (ref 0–5)
Eosinophils Absolute: 0.1 10*3/uL (ref 0.0–0.7)
HEMATOCRIT: 25.1 % — AB (ref 39.0–52.0)
Hemoglobin: 8.1 g/dL — ABNORMAL LOW (ref 13.0–17.0)
Lymphocytes Relative: 17 % (ref 12–46)
Lymphs Abs: 1.7 10*3/uL (ref 0.7–4.0)
MCH: 25.6 pg — ABNORMAL LOW (ref 26.0–34.0)
MCHC: 32.3 g/dL (ref 30.0–36.0)
MCV: 79.2 fL (ref 78.0–100.0)
MONO ABS: 1.6 10*3/uL — AB (ref 0.1–1.0)
Monocytes Relative: 16 % — ABNORMAL HIGH (ref 3–12)
NEUTROS ABS: 6.9 10*3/uL (ref 1.7–7.7)
Neutrophils Relative %: 66 % (ref 43–77)
Platelets: 301 10*3/uL (ref 150–400)
RBC: 3.17 MIL/uL — AB (ref 4.22–5.81)
RDW: 18.3 % — AB (ref 11.5–15.5)
WBC: 10.3 10*3/uL (ref 4.0–10.5)

## 2015-08-09 LAB — COMPREHENSIVE METABOLIC PANEL
ALBUMIN: 1.6 g/dL — AB (ref 3.5–5.0)
ALK PHOS: 89 U/L (ref 38–126)
ALT: 23 U/L (ref 17–63)
ANION GAP: 11 (ref 5–15)
AST: 25 U/L (ref 15–41)
BILIRUBIN TOTAL: 0.7 mg/dL (ref 0.3–1.2)
BUN: 13 mg/dL (ref 6–20)
CO2: 20 mmol/L — ABNORMAL LOW (ref 22–32)
Calcium: 8.4 mg/dL — ABNORMAL LOW (ref 8.9–10.3)
Chloride: 105 mmol/L (ref 101–111)
Creatinine, Ser: 0.69 mg/dL (ref 0.61–1.24)
GFR calc Af Amer: >60 mL/min (ref >60)
Glucose, Bld: 86 mg/dL (ref 65–99)
POTASSIUM: 3.9 mmol/L (ref 3.5–5.1)
Sodium: 136 mmol/L (ref 135–145)
TOTAL PROTEIN: 7.4 g/dL (ref 6.5–8.1)

## 2015-08-09 LAB — URINE MICROSCOPIC-ADD ON

## 2015-08-09 LAB — TROPONIN I
Troponin I: 0.1 ng/mL — ABNORMAL HIGH (ref <0.031)
Troponin I: 0.2 ng/mL — ABNORMAL HIGH (ref <0.031)

## 2015-08-09 LAB — GLUCOSE, CAPILLARY: Glucose-Capillary: 96 mg/dL (ref 65–99)

## 2015-08-09 MED ORDER — BOOST / RESOURCE BREEZE PO LIQD
1.0000 | Freq: Two times a day (BID) | ORAL | Status: DC
  Administered 2015-08-10 – 2015-08-11 (×4): 1 via ORAL
  Filled 2015-08-09 (×6): qty 1

## 2015-08-09 MED ORDER — PANTOPRAZOLE SODIUM 40 MG PO TBEC
40.0000 mg | DELAYED_RELEASE_TABLET | Freq: Every day | ORAL | Status: DC
  Administered 2015-08-10 – 2015-08-11 (×2): 40 mg via ORAL
  Filled 2015-08-09 (×2): qty 1

## 2015-08-09 MED ORDER — SENNOSIDES-DOCUSATE SODIUM 8.6-50 MG PO TABS: 1.0000 | ORAL_TABLET | Freq: Every evening | ORAL | Status: DC | PRN

## 2015-08-09 MED ORDER — STROKE: EARLY STAGES OF RECOVERY BOOK
Freq: Once | Status: AC
  Administered 2015-08-09: 21:00:00

## 2015-08-09 MED ORDER — CARVEDILOL 6.25 MG PO TABS
6.2500 mg | ORAL_TABLET | Freq: Two times a day (BID) | ORAL | Status: DC
  Administered 2015-08-09 – 2015-08-11 (×4): 6.25 mg via ORAL
  Filled 2015-08-09 (×4): qty 1

## 2015-08-09 MED ORDER — ENOXAPARIN SODIUM 100 MG/ML ~~LOC~~ SOLN
100.0000 mg | SUBCUTANEOUS | Status: DC
  Filled 2015-08-09: qty 1

## 2015-08-09 MED ORDER — GADOBENATE DIMEGLUMINE 529 MG/ML IV SOLN
15.0000 mL | Freq: Once | INTRAVENOUS | Status: AC | PRN
  Administered 2015-08-09: 13 mL via INTRAVENOUS

## 2015-08-09 MED ORDER — ENOXAPARIN SODIUM 30 MG/0.3ML ~~LOC~~ SOLN: 100.0000 mg | SUBCUTANEOUS | Status: DC

## 2015-08-09 MED ORDER — HEPARIN (PORCINE) IN NACL 100-0.45 UNIT/ML-% IJ SOLN
1400.0000 [IU]/h | INTRAMUSCULAR | Status: DC
  Administered 2015-08-09: 800 [IU]/h via INTRAVENOUS
  Administered 2015-08-11: 1150 [IU]/h via INTRAVENOUS
  Filled 2015-08-09 (×2): qty 250

## 2015-08-09 MED ORDER — FLUDEOXYGLUCOSE F - 18 (FDG) INJECTION
7.0400 | Freq: Once | INTRAVENOUS | Status: DC | PRN
  Administered 2015-08-09: 7.04 via INTRAVENOUS
  Filled 2015-08-09: qty 7.04

## 2015-08-09 NOTE — ED Provider Notes (Signed)
CSN: [id number]     Arrival date & time 08/09/15  1256 History   First MD Initiated Contact with Patient 08/09/15 1502     Chief Complaint  Patient presents with  . Follow-up     (Consider location/radiation/quality/duration/timing/severity/associated sxs/prior Treatment) The history is provided by the patient, a relative and medical records.   74 year old male recently diagnosed with lung cancer of was directed to come here from MRI scan of following staging MRI and PET scan. MRI of the brain showed multiple small infarcts suggestive of embolic origin. Been generally weak and states that he had no longer once to stand and walk because of weakness in his legs. There is no focal weakness. Family has noted that he is sometimes confused and generally weak. He has not had any chest pain or dyspnea and has been no nausea or vomiting. No focal weaknesses been identified.  Past Medical History  Diagnosis Date  . Depression   . Hx of migraines   . Alcohol abuse     In past  . GERD (gastroesophageal reflux disease)   . SOB (shortness of breath)     sometimes at night  . PUD (peptic ulcer disease)     with bleeding ulcers and bowel obstruction in 1994 and 1995  . DDD (degenerative disc disease), cervical   . DDD (degenerative disc disease), lumbosacral   . Hyperlipidemia   . Stroke     cerebellar CVA  . Shingles   . Asthmatic bronchitis   . Hypertension    History reviewed. No pertinent past surgical history. Family History  Problem Relation Age of Onset  . Heart disease Father   . Heart failure Father   . Lung cancer Sister    Social History  Substance Use Topics  . Smoking status: Current Every Day Smoker -- 0.25 packs/day    Types: Cigarettes  . Smokeless tobacco: Never Used     Comment: Pt smokes 5-6 cigarettes a day  . Alcohol Use: No    Review of Systems  All other systems reviewed and are negative.     Allergies  Review of patient's allergies indicates no known  allergies.  Home Medications   Prior to Admission medications   Medication Sig Start Date End Date Taking? Authorizing Provider  carvedilol (COREG) 6.25 MG tablet Take 1 tablet (6.25 mg total) by mouth 2 (two) times daily with a meal. 07/21/15   Kelvin Cellar, MD  enoxaparin (LOVENOX) 100 MG/ML injection Inject 1 mL (100 mg total) into the skin daily. 08/03/15   Curt Bears, MD  FeAsp-B12-FA-C-DSS-SuccAc-Zn (FERIVA 21/7) 75-1 MG TABS Take 1 tablet by mouth daily. 08/08/15   Janith Lima, MD  feeding supplement (BOOST / RESOURCE BREEZE) LIQD Take 1 Container by mouth 2 (two) times daily between meals. Patient taking differently: Take 1 Container by mouth 2 (two) times daily between meals. Ensure-chocolate 3 daily 07/21/15   Kelvin Cellar, MD  lisinopril (PRINIVIL,ZESTRIL) 10 MG tablet Take 1 tablet (10 mg total) by mouth daily. 07/21/15   Kelvin Cellar, MD  omeprazole (PRILOSEC) 40 MG capsule Take 1 capsule (40 mg total) by mouth daily. 07/13/15   Gregor Hams, MD   BP 163/80 mmHg  Pulse 79  Temp(Src) 98.4 F (36.9 C) (Oral)  Resp 24  SpO2 100% Physical Exam  Nursing note and vitals reviewed.  Slightly cachectic 74 year old male, resting comfortably and in no acute distress. Vital signs are significant for hypertension and tachypnea. Oxygen saturation is 100%,  which is normal. Head is normocephalic and atraumatic. PERRLA, EOMI. Oropharynx is clear. Neck is nontender and supple without adenopathy or JVD. Back is nontender and there is no CVA tenderness. Lungs are clear without rales, wheezes, or rhonchi. Chest is nontender. Heart has regular rate and rhythm without murmur. Abdomen is soft, flat, nontender without masses or hepatosplenomegaly and peristalsis is normoactive. Extremities have no cyanosis or edema, full range of motion is present. Skin is warm and dry without rash. Neurologic: He is awake and alert and oriented, cranial nerves are intact, there are no motor or sensory  deficits. Resting tremor and cogwheel rigidity present.  ED Course  Procedures (including critical care time) Labs Review Results for orders placed or performed during the hospital encounter of 08/09/15  Comprehensive metabolic panel  Result Value Ref Range   Sodium 136 135 - 145 mmol/L   Potassium 3.9 3.5 - 5.1 mmol/L   Chloride 105 101 - 111 mmol/L   CO2 20 (L) 22 - 32 mmol/L   Glucose, Bld 86 65 - 99 mg/dL   BUN 13 6 - 20 mg/dL   Creatinine, Ser 0.69 0.61 - 1.24 mg/dL   Calcium 8.4 (L) 8.9 - 10.3 mg/dL   Total Protein 7.4 6.5 - 8.1 g/dL   Albumin 1.6 (L) 3.5 - 5.0 g/dL   AST 25 15 - 41 U/L   ALT 23 17 - 63 U/L   Alkaline Phosphatase 89 38 - 126 U/L   Total Bilirubin 0.7 0.3 - 1.2 mg/dL   GFR calc non Af Amer >60 >60 mL/min   GFR calc Af Amer >60 >60 mL/min   Anion gap 11 5 - 15  CBC with Differential  Result Value Ref Range   WBC 10.3 4.0 - 10.5 K/uL   RBC 3.17 (L) 4.22 - 5.81 MIL/uL   Hemoglobin 8.1 (L) 13.0 - 17.0 g/dL   HCT 25.1 (L) 39.0 - 52.0 %   MCV 79.2 78.0 - 100.0 fL   MCH 25.6 (L) 26.0 - 34.0 pg   MCHC 32.3 30.0 - 36.0 g/dL   RDW 18.3 (H) 11.5 - 15.5 %   Platelets 301 150 - 400 K/uL   Neutrophils Relative % 66 43 - 77 %   Neutro Abs 6.9 1.7 - 7.7 K/uL   Lymphocytes Relative 17 12 - 46 %   Lymphs Abs 1.7 0.7 - 4.0 K/uL   Monocytes Relative 16 (H) 3 - 12 %   Monocytes Absolute 1.6 (H) 0.1 - 1.0 K/uL   Eosinophils Relative 1 0 - 5 %   Eosinophils Absolute 0.1 0.0 - 0.7 K/uL   Basophils Relative 0 0 - 1 %   Basophils Absolute 0.0 0.0 - 0.1 K/uL  Troponin I  Result Value Ref Range   Troponin I 0.10 (H) <0.031 ng/mL   Imaging Review Mr [name] Wo Contrast  08/09/2015   CLINICAL DATA:  Non-small-cell lung cancer staging.  Confusion.  EXAM: MRI HEAD WITHOUT AND WITH CONTRAST  TECHNIQUE: Multiplanar, multiecho pulse sequences of the brain and surrounding structures were obtained without and with intravenous contrast.  CONTRAST:  42m MULTIHANCE GADOBENATE  DIMEGLUMINE 529 MG/ML IV SOLN  COMPARISON:  07/08/2014  FINDINGS: Calvarium and upper cervical spine: No focal marrow signal abnormality.  Orbits: No significant findings.  Sinuses and Mastoids: Mucosal thickening in the bilateral mastoid air cells.  Brain: There are punctate acute infarcts throughout the bilateral cerebral hemispheric white matter and on the cortex of the bilateral occipital, parietal, and  posterior frontal lobes. The largest infarct is in the left occipital white matter measuring 13 mm. No hemorrhagic conversion is noted.  Stippled enhancement on the surface of the left occipital lobe over a roughly 25 mm linear area. There is also a 5 mm area of cortical enhancement in the left parietal lobe on series 12, image 43.  Relative effacement of left sulci at the vertex which is likely from infarct related swelling.  Generalized brain atrophy with prominent mesial temporal volume loss. Chronic small vessel disease with confluent ischemic gliosis around the lateral ventricles.  Critical Value/emergent results were called by telephone at the time of interpretation on 08/09/2015 at 12:18 pm to Dr. Curt Bears , who verbally acknowledged these results. As requested, the patient is being delivered to the [facility] emergency room.  IMPRESSION: 1. 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 No hemorrhagic conversion. 2. Left occipital and parietal cortex enhancement is likely from subacute infarct. Will need follow-up infused brain MRI in 2 to 3 months to exclude metastatic disease in this patient with history of stage IV lung cancer.   Electronically Signed   By: Monte Fantasia M.D.   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 Morphologic features favor GI or  bronchogenic primary.  EXAM: NUCLEAR MEDICINE PET SKULL BASE TO THIGH  TECHNIQUE: 7.04 mCi F-18 FDG was injected intravenously. Full-ring PET imaging was performed from the skull base to thigh after the radiotracer. CT data was obtained and used for attenuation correction and anatomic localization.  FASTING BLOOD GLUCOSE:  Value: 96 mg/dl  COMPARISON:  Chest CT 07/18/2015.  Abdominal pelvic CT 07/19/2015.  FINDINGS: NECK  There are multiple hypermetabolic supraclavicular lymph nodes bilaterally. The largest node on the left measures 2.3 x 3.4 cm on image 38 and has an SUV max of 19.6. On the right, SUV max is 12.1. No hypermetabolic upper cervical lymph nodes demonstrated.There are no lesions of the pharyngeal mucosal space.  CHEST  There are multiple enlarged and hypermetabolic mediastinal and right hilar lymph nodes. The dominant low right paratracheal node measuring 4.4 x 2.8 cm on image 62 has an SUV max of 19.0. The lobulated right upper lobe pulmonary nodule is hypermetabolic with an SUV max of 12.6. A medially superior to the right hilum, there is additional nodular area of hypermetabolic activity, likely nodal. There is probable mild postobstructive pneumonitis posteriorly in the right upper lobe. Biapical scarring, bibasilar atelectasis and small bilateral pleural effusions are present. There is no abnormal metabolic activity within the pleural space. There is a small pericardial effusion.  ABDOMEN/PELVIS  There is nonspecific low level hypermetabolic activity within the adrenal glands (SUV max 4.2 on the right and 3.5 on the left). There is no focal adrenal nodule, and this is favored to be physiologic. There is no abnormal metabolic activity within liver, spleen or pancreas. There is no hypermetabolic nodal activity. Left renal cysts, diffuse atherosclerosis and ascites again noted.  SKELETON  There is no hypermetabolic activity to suggest osseous metastatic disease. Soft tissue activity posterior to the  left femoral greater trochanter, likely inflammatory.  IMPRESSION: 1. Right upper lobe lung cancer with extensive hypermetabolic nodal disease to the right hilum, mediastinum and supraclavicular stations bilaterally. By PET, this is consistent with stage IIIB disease (T1a N3 M0). 2.  Low-level adrenal activity bilaterally without focal nodularity, likely physiologic. 3. Similar pleural effusions and ascites. 4. Diffuse atherosclerosis.   Electronically Signed   By: Richardean Sale M.D.   On: 08/09/2015 11:12   I have personally reviewed and evaluated these images and lab results as part of my medical decision-making.   EKG Interpretation   Date/Time:  Wednesday August 09 2015 15:26:46 EDT Ventricular Rate:  77 PR Interval:  151 QRS Duration: 138 QT Interval:  441 QTC Calculation: 499 R Axis:   -33 Text Interpretation:  Sinus rhythm Consider right atrial enlargement Left  bundle branch block When compared with ECG of 07/18/2015, No significant  change was found Confirmed by [facility]  MD, Sharonne Ricketts ([id number]) on 08/09/2015  4:55:11 PM      CRITICAL CARE Performed by: FEOFH,[email or web address] Total critical care time: 40 minutes Critical care time was exclusive of separately billable procedures and treating other patients. Critical care was necessary to treat or prevent imminent or life-threatening deterioration. Critical care was time spent personally by me on the following activities: development of treatment plan with patient and/or surrogate as well as nursing, discussions with consultants, evaluation of patient's response to treatment, examination of patient, obtaining history from patient or surrogate, ordering and performing treatments and interventions, ordering and review of laboratory studies, ordering and review of radiographic studies, pulse oximetry and re-evaluation of patient's condition.  MDM   Final diagnoses:  Stroke  Non-small cell carcinoma of lung, stage 4, unspecified laterality   Protein-calorie malnutrition, severe  Elevated troponin I level  Hypochromic anemia    MRI showing multiple acute infarcts suggestive of embolic origin. Patient is already anticoagulated with enoxaparin for history of pulmonary emboli. He will need to be admitted for further workup. Review of old records confirms recent diagnosis of lung cancer for which he is undergoing staging prior to initiating therapy. Also, recent diagnosis of pulmonary embolism for which he is on enoxaparin. Case is discussed with Dr. Broadus John of [facility] who agrees to admit the patient. Mildly elevated troponin is noted of uncertain significance. He is already anticoagulated with enoxaparin, so if this is truly of cardiac margin, it is already being treated appropriately.    Delora Fuel, MD [id number]

## 2015-08-09 NOTE — Telephone Encounter (Signed)
Horris Latino from MRI called [phone] called to say that patient/family had left MRI without waiting for instructions from Dr. Julien Nordmann.  Dr. Julien Nordmann wants them to go directly to [facility] ED.  She thinks perhaps patient came to our waiting room.  She asked me to see if they are there and if so to send them to [facility] ED per Dr. Julien Nordmann.   Found patient with son and brother-in-law in our waiting room.  Let them know that Dr. Julien Nordmann wants them to go directly to [facility] ED (not [facility]).  Brother-in-law says that they can get him there and he knows where it is.  Let them know if they are unable to get him there we will call EMS to transport.  Wrapped existing IV in right forearm to protect it.  They left for [facility] ED.  Called Horris Latino back to let her know.  Also let Abelina Bachelor RN for Dr. Julien Nordmann know that patient is in our lobby and will go to [facility] ED.

## 2015-08-09 NOTE — Consult Note (Signed)
Stroke Consult    Chief Complaint: weakness HPI: Jared Rogers is an 74 y.o. 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 Family reports no acute change in function or mental status. They report ongoing weight loss, generalized weakness and poor PO intake.   MRI brain imaging reviewed, shows numerous small acute infarcts in the bilateral cerebral hemisphere.   Date last known well: unclear Time last known well: unclear tPA Given: no, unclear LSW  Past Medical History  Diagnosis Date  . Depression   . Hx of migraines   . Alcohol abuse     In past  . GERD (gastroesophageal reflux disease)   . SOB (shortness of breath)     sometimes at night  . PUD (peptic ulcer disease)     with bleeding ulcers and bowel obstruction in 1994 and 1995  . DDD (degenerative disc disease), cervical   . DDD (degenerative disc disease), lumbosacral   . Hyperlipidemia   . Stroke     cerebellar CVA  . Shingles   . Asthmatic bronchitis   . Hypertension     History reviewed. No pertinent past surgical history.  Family History  Problem Relation Age of Onset  . Heart disease Father   . Heart failure Father   . Lung cancer Sister    Social History:  reports that he has been smoking Cigarettes.  He has been smoking about 0.25 packs per day. He has never used smokeless tobacco. He reports that he does not drink alcohol or use illicit drugs.  Allergies: No Known Allergies  Medications Prior to Admission  Medication Sig Dispense Refill  . carvedilol (COREG) 6.25 MG tablet Take 1 tablet (6.25 mg total) by mouth 2 (two) times daily with a meal. 60 tablet 1  . enoxaparin (LOVENOX) 100 MG/ML injection Inject 1 mL (100 mg total) into the skin daily. 30 Syringe 2  . feeding supplement (BOOST / RESOURCE BREEZE) LIQD Take 1 Container by mouth 2 (two) times daily  between meals. (Patient taking differently: Take 1 Container by mouth 2 (two) times daily between meals. Ensure-chocolate 3 daily) 20 Container 0  . lisinopril (PRINIVIL,ZESTRIL) 10 MG tablet Take 1 tablet (10 mg total) by mouth daily. 30 tablet 1  . omeprazole (PRILOSEC) 40 MG capsule Take 1 capsule (40 mg total) by mouth daily. 30 capsule 3  . FeAsp-B12-FA-C-DSS-SuccAc-Zn (FERIVA 21/7) 75-1 MG TABS Take 1 tablet by mouth daily. (Patient not taking: Reported on 08/09/2015) 28 tablet 11    ROS: Out of a complete 14 system review, the patient complains of only the following symptoms, and all other reviewed systems are negative. +weakness  Physical Examination: Filed Vitals:   08/09/15 2047  BP: 137/77  Pulse: 80  Temp: 98.2 F (36.8 C)  Resp: 18   Physical Exam  Constitutional: He appears well-developed and well-nourished.  Psych: Affect appropriate to situation Eyes: No scleral injection HENT: No OP obstrucion Head: Normocephalic.  Cardiovascular: Normal rate and regular rhythm.  Respiratory: Effort normal and breath sounds normal.  GI: Soft. Bowel sounds are normal. No distension. There is no tenderness.  Skin: WDI  Neurologic Examination: Mental Status: limited to to language barrier Alert, will mimic simple commands, able to state name and follow simple commands in Spanish Cranial Nerves: II: optic discs not visualized, visual fields grossly normal, pupils equal, round, reactive to  light  III,IV, VI: ptosis not present, extra-ocular motions intact bilaterally V,VII: smile symmetric, facial light touch sensation normal bilaterally VIII: hearing normal bilaterally IX,X: gag reflex present XI: trapezius strength/neck flexion strength normal bilaterally XII: tongue strength normal  Motor: Unable to formally assess but appears to move all extremities symmetrically and against light resistance. Noted proximal bilateral LE weakness Tone and bulk:normal tone throughout; no  atrophy noted Sensory: Pinprick and light touch intact throughout, bilaterally Deep Tendon Reflexes: 1+ and symmetric throughout Plantars: Right: downgoing   Left: downgoing Cerebellar: Unable to test Gait: deferred  Laboratory Studies:   Basic Metabolic Panel:  Recent Labs Lab 08/09/15 1520  NA 136  K 3.9  CL 105  CO2 20*  GLUCOSE 86  BUN 13  CREATININE 0.69  CALCIUM 8.4*    Liver Function Tests:  Recent Labs Lab 08/09/15 1520  AST 25  ALT 23  ALKPHOS 89  BILITOT 0.7  PROT 7.4  ALBUMIN 1.6*   No results for input(s): LIPASE, AMYLASE in the last 168 hours. No results for input(s): AMMONIA in the last 168 hours.  CBC:  Recent Labs Lab 08/09/15 1520  WBC 10.3  NEUTROABS 6.9  HGB 8.1*  HCT 25.1*  MCV 79.2  PLT 301    Cardiac Enzymes:  Recent Labs Lab 08/09/15 1520  TROPONINI 0.10*    BNP: Invalid input(s): POCBNP  CBG:  Recent Labs Lab 08/09/15 0914  GLUCAP 69    Microbiology: No results found for this or any previous visit.  Coagulation Studies: No results for input(s): LABPROT, INR in the last 72 hours.  Urinalysis:  Recent Labs Lab 08/09/15 1745  COLORURINE AMBER*  LABSPEC 1.030  PHURINE 6.0  GLUCOSEU NEGATIVE  HGBUR NEGATIVE  BILIRUBINUR SMALL*  KETONESUR 15*  PROTEINUR 30*  UROBILINOGEN 2.0*  NITRITE NEGATIVE  LEUKOCYTESUR NEGATIVE    Lipid Panel:     Component Value Date/Time   CHOL 140 07/13/2015 1013   TRIG 71 07/13/2015 1013   HDL 40 07/13/2015 1013   CHOLHDL 3.5 07/13/2015 1013   VLDL 14 07/13/2015 1013   LDLCALC 86 07/13/2015 1013    HgbA1C:  Lab Results  Component Value Date   HGBA1C 5.1 07/18/2015    Urine Drug Screen:  No results found for: LABOPIA, COCAINSCRNUR, LABBENZ, AMPHETMU, THCU, LABBARB  Alcohol Level: No results for input(s): ETH in the last 168 hours.  Other results:  Imaging: Mr Kizzie Fantasia Contrast  08/09/2015   CLINICAL DATA:  Non-small-cell lung cancer staging.  Confusion.   EXAM: MRI HEAD WITHOUT AND WITH CONTRAST  TECHNIQUE: Multiplanar, multiecho pulse sequences of the brain and surrounding structures were obtained without and with intravenous contrast.  CONTRAST:  45m MULTIHANCE GADOBENATE DIMEGLUMINE 529 MG/ML IV SOLN  COMPARISON:  07/08/2014  FINDINGS: Calvarium and upper cervical spine: No focal marrow signal abnormality.  Orbits: No significant findings.  Sinuses and Mastoids: Mucosal thickening in the bilateral mastoid air cells.  Brain: There are punctate acute infarcts throughout the bilateral cerebral hemispheric white matter and on the cortex of the bilateral occipital, parietal, and posterior frontal lobes. The largest infarct is in the left occipital white matter measuring 13 mm. No hemorrhagic conversion is noted.  Stippled enhancement on the surface of the left occipital lobe over a roughly 25 mm linear area. There is also a 5 mm area of cortical enhancement in the left parietal lobe on series 12, image 43.  Relative effacement of left sulci at the vertex which is likely from  infarct related swelling.  Generalized brain atrophy with prominent mesial temporal volume loss. Chronic small vessel disease with confluent ischemic gliosis around the lateral ventricles.  Critical Value/emergent results were called by telephone at the time of interpretation on 08/09/2015 at 12:18 pm to Dr. Curt Bears , who verbally acknowledged these results. As requested, the patient is being delivered to the [facility] emergency room.  IMPRESSION: 1. 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 No hemorrhagic conversion. 2. Left occipital and parietal cortex enhancement is likely from subacute infarct. Will need follow-up infused brain MRI in 2 to 3 months to exclude metastatic disease in this patient with history of stage IV lung cancer.   Electronically Signed   By: Monte Fantasia M.D.   On: 08/09/2015 12:20   Nm Pet Image  Initial (pi) Skull Base To Thigh  08/09/2015   CLINICAL DATA:  Initial treatment strategy for metastatic poorly differentiated adenocarcinoma on right supraclavicular node the 07/19/2015. Morphologic features favor GI or bronchogenic primary.  EXAM: NUCLEAR MEDICINE PET SKULL BASE TO THIGH  TECHNIQUE: 7.04 mCi F-18 FDG was injected intravenously. Full-ring PET imaging was performed from the skull base to thigh after the radiotracer. CT data was obtained and used for attenuation correction and anatomic localization.  FASTING BLOOD GLUCOSE:  Value: 96 mg/dl  COMPARISON:  Chest CT 07/18/2015.  Abdominal pelvic CT 07/19/2015.  FINDINGS: NECK  There are multiple hypermetabolic supraclavicular lymph nodes bilaterally. The largest node on the left measures 2.3 x 3.4 cm on image 38 and has an SUV max of 19.6. On the right, SUV max is 12.1. No hypermetabolic upper cervical lymph nodes demonstrated.There are no lesions of the pharyngeal mucosal space.  CHEST  There are multiple enlarged and hypermetabolic mediastinal and right hilar lymph nodes. The dominant low right paratracheal node measuring 4.4 x 2.8 cm on image 62 has an SUV max of 19.0. The lobulated right upper lobe pulmonary nodule is hypermetabolic with an SUV max of 12.6. A medially superior to the right hilum, there is additional nodular area of hypermetabolic activity, likely nodal. There is probable mild postobstructive pneumonitis posteriorly in the right upper lobe. Biapical scarring, bibasilar atelectasis and small bilateral pleural effusions are present. There is no abnormal metabolic activity within the pleural space. There is a small pericardial effusion.  ABDOMEN/PELVIS  There is nonspecific low level hypermetabolic activity within the adrenal glands (SUV max 4.2 on the right and 3.5 on the left). There is no focal adrenal nodule, and this is favored to be physiologic. There is no abnormal metabolic activity within liver, spleen or pancreas. There is no  hypermetabolic nodal activity. Left renal cysts, diffuse atherosclerosis and ascites again noted.  SKELETON  There is no hypermetabolic activity to suggest osseous metastatic disease. Soft tissue activity posterior to the left femoral greater trochanter, likely inflammatory.  IMPRESSION: 1. Right upper lobe lung cancer with extensive hypermetabolic nodal disease to the right hilum, mediastinum and supraclavicular stations bilaterally. By PET, this is consistent with stage IIIB disease (T1a N3 M0). 2. Low-level adrenal activity bilaterally without focal nodularity, likely physiologic. 3. Similar pleural effusions and ascites. 4. Diffuse atherosclerosis.   Electronically Signed   By: Richardean Sale M.D.   On: 08/09/2015 11:12    Assessment: 74 y.o. male hx of stage 4 lung CA, PE (on lovenox), dementia sent to ED after staging MRI shows findings concerning for embolic CVA. Started on heparin drip by primary team.  Plan: 1. HgbA1c, fasting lipid panel 2. MRA  of the brain without contrast 3. PT consult, OT consult, Speech consult 4. Echocardiogram 5. Carotid dopplers 6. Prophylactic therapy-currently on heparin drip 7. Risk factor modification 8. Telemetry monitoring 9. Frequent neuro checks 10. NPO until RN stroke swallow screen   Jim Like, DO [facility] [phone]  If 7pm- 7am, please page neurology on call as listed in [place]. 08/09/2015, 9:21 PM

## 2015-08-09 NOTE — H&P (Signed)
Jared Rogers History and Physical  Jared Rogers WUJ:[id number] DOB: 1941-02-14 DOA: 08/09/2015  Referring physician: EDP PCP: Jared Calico, MD   Chief Complaint:  abnormal MR  HPI: Jared Rogers is a 74 y.o. male with stage IV non-small cell lung cancer, recently diagnosed is undergoing staging workup, history of pulmonary embolism on SQ Lovenox, dementia, peptic ulcer disease, severe protein calorie malnutrition was sent to the ER from [facility] due to abnormal MRI brain which revealed acute infarcts. History is provided by patient's son at bedside, he denies any recent change in his function at baseline, her son just reports ongoing weight loss, generalized weakness, and poor by mouth intake recently . They deny any facial droop any speech disturbance or swallowing problems.   MRI brain showed small acute infarcts and old prior infarcts, no evidence of brain metastasis , this report was called to Dr. Inda Rogers that the cancer center who directed him to [facility] emergency room    Review of Systems: Positives bolded Co was determined she shouldInstitutional:  No weight loss, night sweats, Fevers, chills, fatigue.  HEENT:  No headaches, Difficulty swallowing,Tooth/dental problems,Sore throat,  No sneezing, itching, ear ache, nasal congestion, post nasal drip,  Cardio-vascular:  No chest pain, Orthopnea, PND, swelling in lower extremities, anasarca, dizziness, palpitations  GI:  No heartburn, indigestion, abdominal pain, nausea, vomiting, diarrhea, change in bowel habits, loss of appetite  Resp:  No shortness of breath with exertion or at rest. No excess mucus, no productive cough, No non-productive cough, No coughing up of blood.No change in color of mucus.No wheezing.No chest wall deformity  Skin:  no rash or lesions.  GU:  no dysuria, change in color of urine, no urgency or frequency. No flank pain.  Musculoskeletal:  No joint pain or swelling. No  decreased range of motion. No back pain.  Psych:  No change in mood or affect. No depression or anxiety. No memory loss.   Past Medical History  Diagnosis Date  . Depression   . Hx of migraines   . Alcohol abuse     In past  . GERD (gastroesophageal reflux disease)   . SOB (shortness of breath)     sometimes at night  . PUD (peptic ulcer disease)     with bleeding ulcers and bowel obstruction in 1994 and 1995  . DDD (degenerative disc disease), cervical   . DDD (degenerative disc disease), lumbosacral   . Hyperlipidemia   . Stroke     cerebellar CVA  . Shingles   . Asthmatic bronchitis   . Hypertension    History reviewed. No pertinent past surgical history. Social History:  reports that he has been smoking Cigarettes.  He has been smoking about 0.25 packs per day. He has never used smokeless tobacco. He reports that he does not drink alcohol or use illicit drugs.  No Known Allergies  Family History  Problem Relation Age of Onset  . Heart disease Father   . Heart failure Father   . Lung cancer Sister     Prior to Admission medications   Medication Sig Start Date End Date Taking? Authorizing Provider  carvedilol (COREG) 6.25 MG tablet Take 1 tablet (6.25 mg total) by mouth 2 (two) times daily with a meal. 07/21/15  Yes Jared Cellar, MD  enoxaparin (LOVENOX) 100 MG/ML injection Inject 1 mL (100 mg total) into the skin daily. 08/03/15  Yes Jared Bears, MD  feeding supplement (BOOST / RESOURCE BREEZE) LIQD Take 1 Container  by mouth 2 (two) times daily between meals. Patient taking differently: Take 1 Container by mouth 2 (two) times daily between meals. Ensure-chocolate 3 daily 07/21/15  Yes Jared Cellar, MD  lisinopril (PRINIVIL,ZESTRIL) 10 MG tablet Take 1 tablet (10 mg total) by mouth daily. 07/21/15  Yes Jared Cellar, MD  omeprazole (PRILOSEC) 40 MG capsule Take 1 capsule (40 mg total) by mouth daily. 07/13/15  Yes Jared Hams, MD  FeAsp-B12-FA-C-DSS-SuccAc-Zn  (FERIVA 21/7) 75-1 MG TABS Take 1 tablet by mouth daily. Patient not taking: Reported on 08/09/2015 08/08/15   Jared Lima, MD   Physical Exam: Filed Vitals:   08/09/15 1600 08/09/15 1630 08/09/15 1645 08/09/15 1700  BP: 162/81 157/82 152/82 142/65  Pulse: 79 80 77 83  Temp:      TempSrc:      Resp: '17 17 15 20  '$ SpO2: 100% 98% 98% 98%    Wt Readings from Last 3 Encounters:  08/08/15 63.05 kg (139 lb)  08/03/15 64.184 kg (141 lb 8 oz)  07/28/15 62.596 kg (138 lb)    General:  Elderly, cachectic male laying in stretcher in no distress Eyes: PERRL, normal lids, irises & conjunctiva ENT: grossly normal hearing, lips & tongue Neck: no LAD, masses or thyromegaly Cardiovascular: RRR, no m/r/g. No LE edema. Telemetry: SR, no arrhythmias  Respiratory: CTA bilaterally, no w/r/r. Normal respiratory effort. Abdomen: soft, ntnd Skin: no rash or induration seen on limited exam Se normal tone BUE/BLE P is a a positives bolded sychiatric: grossly normal mood and affect, speech fluent and appropriate Neurologic: Cranial nerves II-12 intact , motor 5/5 upper and lower extremities proximal and distal muscle groups, sensory light touch intact she  -Plantars are withdrawal she           Labs on Admission:  Basic Metabolic Panel:  Recent Labs Lab 08/09/15 1520  NA 136  K 3.9  CL 105  CO2 20*  GLUCOSE 86  BUN 13  CREATININE 0.69  CALCIUM 8.4*   Liver Function Tests:  Recent Labs Lab 08/09/15 1520  AST 25  ALT 23  ALKPHOS 89  BILITOT 0.7  PROT 7.4  ALBUMIN 1.6*   No results for input(s): LIPASE, AMYLASE in the last 168 hours. No results for input(s): AMMONIA in the last 168 hours. CBC:  Recent Labs Lab 08/09/15 1520  WBC 10.3  NEUTROABS 6.9  HGB 8.1*  HCT 25.1*  MCV 79.2  PLT 301   Cardiac Enzymes:  Recent Labs Lab 08/09/15 1520  TROPONINI 0.10*    BNP (last 3 results) No results for input(s): BNP in the last 8760 hours.  ProBNP (last 3 results) No  results for input(s): PROBNP in the last 8760 hours.  CBG:  Recent Labs Lab 08/09/15 0914  GLUCAP 62    Radiological Exams on Admission: Mr [name] GG Contrast  08/09/2015   CLINICAL DATA:  Non-small-cell lung cancer staging.  Confusion.  EXAM: MRI HEAD WITHOUT AND WITH CONTRAST  TECHNIQUE: Multiplanar, multiecho pulse sequences of the brain and surrounding structures were obtained without and with intravenous contrast.  CONTRAST:  26m MULTIHANCE GADOBENATE DIMEGLUMINE 529 MG/ML IV SOLN  COMPARISON:  07/08/2014  FINDINGS: Calvarium and upper cervical spine: No focal marrow signal abnormality.  Orbits: No significant findings.  Sinuses and Mastoids: Mucosal thickening in the bilateral mastoid air cells.  Brain: There are punctate acute infarcts throughout the bilateral cerebral hemispheric white matter and on the cortex of the bilateral occipital, parietal, and posterior frontal lobes. The  largest infarct is in the left occipital white matter measuring 13 mm. No hemorrhagic conversion is noted.  Stippled enhancement on the surface of the left occipital lobe over a roughly 25 mm linear area. There is also a 5 mm area of cortical enhancement in the left parietal lobe on series 12, image 43.  Relative effacement of left sulci at the vertex which is likely from infarct related swelling.  Generalized brain atrophy with prominent mesial temporal volume loss. Chronic small vessel disease with confluent ischemic gliosis around the lateral ventricles.  Critical Value/emergent results were called by telephone at the time of interpretation on 08/09/2015 at 12:18 pm to Dr. Curt Rogers , who verbally acknowledged these results. As requested, the patient is being delivered to the [facility] emergency room.  IMPRESSION: 1. 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 No hemorrhagic conversion. 2. Left occipital and parietal cortex enhancement is likely  from subacute infarct. Will need follow-up infused brain MRI in 2 to 3 months to exclude metastatic disease in this patient with history of stage IV lung cancer.   Electronically Signed   By: Monte Fantasia M.D.   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 Morphologic features favor GI or bronchogenic primary.  EXAM: NUCLEAR MEDICINE PET SKULL BASE TO THIGH  TECHNIQUE: 7.04 mCi F-18 FDG was injected intravenously. Full-ring PET imaging was performed from the skull base to thigh after the radiotracer. CT data was obtained and used for attenuation correction and anatomic localization.  FASTING BLOOD GLUCOSE:  Value: 96 mg/dl  COMPARISON:  Chest CT 07/18/2015.  Abdominal pelvic CT 07/19/2015.  FINDINGS: NECK  There are multiple hypermetabolic supraclavicular lymph nodes bilaterally. The largest node on the left measures 2.3 x 3.4 cm on image 38 and has an SUV max of 19.6. On the right, SUV max is 12.1. No hypermetabolic upper cervical lymph nodes demonstrated.There are no lesions of the pharyngeal mucosal space.  CHEST  There are multiple enlarged and hypermetabolic mediastinal and right hilar lymph nodes. The dominant low right paratracheal node measuring 4.4 x 2.8 cm on image 62 has an SUV max of 19.0. The lobulated right upper lobe pulmonary nodule is hypermetabolic with an SUV max of 12.6. A medially superior to the right hilum, there is additional nodular area of hypermetabolic activity, likely nodal. There is probable mild postobstructive pneumonitis posteriorly in the right upper lobe. Biapical scarring, bibasilar atelectasis and small bilateral pleural effusions are present. There is no abnormal metabolic activity within the pleural space. There is a small pericardial effusion.  ABDOMEN/PELVIS  There is nonspecific low level  hypermetabolic activity within the adrenal glands (SUV max 4.2 on the right and 3.5 on the left). There is no focal adrenal nodule, and this is favored to be physiologic. There is no abnormal metabolic activity within liver, spleen or pancreas. There is no hypermetabolic nodal activity. Left renal cysts, diffuse atherosclerosis and ascites again noted.  SKELETON  There is no hypermetabolic activity to suggest osseous metastatic disease. Soft tissue activity posterior to the left femoral greater trochanter, likely inflammatory.  IMPRESSION: 1. Right upper lobe lung cancer with extensive hypermetabolic nodal disease to the right hilum, mediastinum and supraclavicular stations bilaterally. By PET, this is consistent with stage IIIB disease (T1a N3 M0). 2. Low-level adrenal activity bilaterally  without focal nodularity, likely physiologic. 3. Similar pleural effusions and ascites. 4. Diffuse atherosclerosis.   Electronically Signed   By: Richardean Sale M.D.   On: 08/09/2015 11:12    EKG: Independently reviewed. NSR,   Assessment/Plan     CVA (cerebral infarction) -admit to tele, neurology consulted per EDP  -Check MRA brain, 2-D echocardiogram, carotid duplex  -PT OT speech eval  -will continue lovenox -FU LDL and Hbaic  H/o PE -continue full dose lovenox  Minimally elevated troponin -Denies any chest pain EKG with left bundle unchanged -We'll cycle troponins, follow-up 2-D echocardiogram,  I do not think he needs further cardiac workup unless troponins trend up significantly or 2-D echo abnormal   PUD -continue PPI   Dementia with behavioral dist YLX urbance    Protein-calorie malnutrition, severe -Continue supplements    Non-small cell carcinoma of lung, stage 4 -Followed by Dr. Julien Nordmann, he is aware of admission  Anemia of chronic disease  -Chronic, stable   Code Status:  DO NOT RESUSCITATE, this was discussed with patient's son DVT Prophylaxis: lovenox Family Communication: son  at bedside Disposition Plan: home when improved  Time spent: 23mn  Finn Amos [facility] Pager [phone]

## 2015-08-09 NOTE — ED Notes (Signed)
Pt has stage 4 cancer, had MRI done today to r/o that cancer has spread to brain. Pt and family members were told to come immediately here due to abnormal results, +infarct. Family unable to describe any recent abnormal symptoms. Pt appears in no acute distress at triage.

## 2015-08-09 NOTE — Progress Notes (Signed)
ANTICOAGULATION CONSULT NOTE - Initial Consult  Pharmacy Consult:  Heparin Indication:  History of PE  No Known Allergies  Patient Measurements: Height = 72 inches Weight = 63.1 inches Heparin Dosing Weight: 63 kg  Vital Signs: Temp: 98.2 F (36.8 C) (08/24 2047) Temp Source: Oral (08/24 2047) BP: 137/77 mmHg (08/24 2047) Pulse Rate: 80 (08/24 2047)  Labs:  Recent Labs  08/09/15 1520  HGB 8.1*  HCT 25.1*  PLT 301  CREATININE 0.69  TROPONINI 0.10*    Estimated Creatinine Clearance: 73.4 mL/min (by C-G formula based on Cr of 0.69).   Medical History: Past Medical History  Diagnosis Date  . Depression   . Hx of migraines   . Alcohol abuse     In past  . GERD (gastroesophageal reflux disease)   . SOB (shortness of breath)     sometimes at night  . PUD (peptic ulcer disease)     with bleeding ulcers and bowel obstruction in 1994 and 1995  . DDD (degenerative disc disease), cervical   . DDD (degenerative disc disease), lumbosacral   . Hyperlipidemia   . Stroke     cerebellar CVA  . Shingles   . Asthmatic bronchitis   . Hypertension       Assessment: 73 YOM on full-dose Lovenox PTA for history of PE.  He presented with acute stroke but no hemorrhagic conversion on MRI.  Patient also has lung cancer.  Pharmacy consulted to transition patient to IV heparin.  His last Lovenox dose was yesterday morning around 0700 per patient.  Baseline labs reviewed.   Goal of Therapy:  HL 0.3 - 0.5 units/mL during acute stroke Monitor platelets by anticoagulation protocol: Yes    Plan:  - Heparin gtt at 800 units/hr, no bolus - Check 8 hr HL - Daily HL / CBC    Emmanual Gauthreaux D. Mina Marble, PharmD, BCPS Pager:  [phone] 08/09/2015, 9:50 PM

## 2015-08-09 NOTE — Progress Notes (Signed)
Pt arrived to unit via stretcher at [place]. Accompanied by son and daughter-in-law. Vital signs obtained and telemetry applied. Pt has no complaints of pain at this time. Wendee Copp ,

## 2015-08-10 ENCOUNTER — Inpatient Hospital Stay (HOSPITAL_COMMUNITY): Payer: PPO

## 2015-08-10 ENCOUNTER — Ambulatory Visit (HOSPITAL_COMMUNITY): Payer: PPO

## 2015-08-10 DIAGNOSIS — R778 Other specified abnormalities of plasma proteins: Secondary | ICD-10-CM | POA: Insufficient documentation

## 2015-08-10 DIAGNOSIS — I639 Cerebral infarction, unspecified: Principal | ICD-10-CM

## 2015-08-10 DIAGNOSIS — R7989 Other specified abnormal findings of blood chemistry: Secondary | ICD-10-CM

## 2015-08-10 LAB — LIPID PANEL
CHOLESTEROL: 126 mg/dL (ref 0–200)
HDL: 25 mg/dL — ABNORMAL LOW (ref >40)
LDL CALC: 93 mg/dL (ref 0–99)
TRIGLYCERIDES: 40 mg/dL (ref <150)
Total CHOL/HDL Ratio: 5 RATIO
VLDL: 8 mg/dL (ref 0–40)

## 2015-08-10 LAB — CBC
HCT: 24.2 % — ABNORMAL LOW (ref 39.0–52.0)
Hemoglobin: 7.8 g/dL — ABNORMAL LOW (ref 13.0–17.0)
MCH: 25.8 pg — ABNORMAL LOW (ref 26.0–34.0)
MCHC: 32.2 g/dL (ref 30.0–36.0)
MCV: 80.1 fL (ref 78.0–100.0)
Platelets: 287 K/uL (ref 150–400)
RBC: 3.02 MIL/uL — ABNORMAL LOW (ref 4.22–5.81)
RDW: 18.3 % — ABNORMAL HIGH (ref 11.5–15.5)
WBC: 9.8 K/uL (ref 4.0–10.5)

## 2015-08-10 LAB — HEPARIN LEVEL (UNFRACTIONATED)
Heparin Unfractionated: <0.1 IU/mL — ABNORMAL LOW (ref 0.30–0.70)
Heparin Unfractionated: <0.1 [IU]/mL — ABNORMAL LOW (ref 0.30–0.70)

## 2015-08-10 LAB — TROPONIN I: Troponin I: 0.24 ng/mL — ABNORMAL HIGH (ref <0.031)

## 2015-08-10 MED ORDER — ATORVASTATIN CALCIUM 10 MG PO TABS
20.0000 mg | ORAL_TABLET | Freq: Every day | ORAL | Status: DC
Start: 2015-08-10 — End: 2015-08-11
  Administered 2015-08-10: 20 mg via ORAL
  Filled 2015-08-10: qty 2

## 2015-08-10 NOTE — Progress Notes (Signed)
PROGRESS NOTE  Jared Rogers JSE:[id number] DOB: [birth year] DOA: 08/09/2015 PCP: Scarlette Calico, MD  Assessment/Plan: CVA (cerebral infarction) -neuro consult -Check MRA brain, 2-D echocardiogram, carotid duplex  -PT OT speech eval  Heparin gtt -FU LDL: 93- add statin Hbaic  H/o PE -continue full dose lovenox  Minimally elevated troponin -Denies any chest pain EKG with left bundle unchanged -We'll cycle troponins, follow-up 2-D echocardiogram, I do not think he needs further cardiac workup unless troponins trend up significantly or 2-D echo abnormal  PUD -continue PPI  Dementia with behavioral disturbance   Protein-calorie malnutrition, severe -Continue supplements   Non-small cell carcinoma of lung, stage 3B -Followed by Dr. Julien Nordmann, he is aware of admission-- still in planning stage  Anemia of chronic disease  -Chronic, stable   Code Status: DNR Family Communication: called care giver Disposition Plan:    Consultants:  neurology  Procedures:      HPI/Subjective: No complaints  Objective: Filed Vitals:   08/10/15 1016  BP: 126/66  Pulse: 77  Temp: 98.8 F (37.1 C)  Resp: 20   No intake or output data in the 24 hours ending 08/10/15 1221 Filed Weights   08/09/15 2200  Weight: 63.1 kg (139 lb 1.8 oz)    Exam:   General:  Pleasant/cooperative  Cardiovascular: rrr  Respiratory: clear  Abdomen: +BS, soft  Musculoskeletal: no edema   Data Reviewed: Basic Metabolic Panel:  Recent Labs Lab 08/09/15 1520  NA 136  K 3.9  CL 105  CO2 20*  GLUCOSE 86  BUN 13  CREATININE 0.69  CALCIUM 8.4*   Liver Function Tests:  Recent Labs Lab 08/09/15 1520  AST 25  ALT 23  ALKPHOS 89  BILITOT 0.7  PROT 7.4  ALBUMIN 1.6*   No results for input(s): LIPASE, AMYLASE in the last 168 hours. No results for input(s): AMMONIA in the last 168 hours. CBC:  Recent Labs Lab 08/09/15 1520 08/10/15 0816  WBC 10.3 9.8  NEUTROABS  6.9  --   HGB 8.1* 7.8*  HCT 25.1* 24.2*  MCV 79.2 80.1  PLT 301 287   Cardiac Enzymes:  Recent Labs Lab 08/09/15 1520 08/09/15 2205 08/10/15 0325  TROPONINI 0.10* 0.20* 0.24*   BNP (last 3 results) No results for input(s): BNP in the last 8760 hours.  ProBNP (last 3 results) No results for input(s): PROBNP in the last 8760 hours.  CBG:  Recent Labs Lab 08/09/15 0914  GLUCAP 96    No results found for this or any previous visit (from the past 240 hour(s)).   Studies: Mr Angiogram Head Wo Contrast  08/10/2015   CLINICAL DATA:  Follow up stroke suggesting embolic disease.  EXAM: MRA HEAD WITHOUT CONTRAST  TECHNIQUE: Angiographic images of the Circle of Willis were obtained using MRA technique without intravenous contrast.  COMPARISON:  Brain MRI from yesterday  FINDINGS: Symmetric vertebral arteries, widely patent into the patent basilar. Left PICA and dominant right AICA. Symmetric superior cerebellar arteries. Mild atherosclerotic narrowing of the upper left PCA branch. No treatable or flow limiting stenosis.  Symmetric carotid arteries. No cavernous sinus stenosis. Irregularity of the mid right cavernous sinus with small medial outpouching is likely atherosclerotic. Small anterior communicating artery is likely present. Symmetric an open M1 and A1 segments. No evidence of branch vessel occlusion. There are 2 outpouchings from the left supraclinoid carotid artery which are directed inferiorly. The more distal outpouching appears to have an emanating vessel on source images, the more proximal without definite vessel.  IMPRESSION: 1. No acute arterial finding or flow limiting stenosis. 2. Two 2 mm outpouchings from the left supraclinoid carotid artery could reflect infundibula or aneurysms.   Electronically Signed   By: Monte Fantasia M.D.   On: 08/10/2015 11:59   Mr [name] KV Contrast  08/09/2015   CLINICAL DATA:  Non-small-cell lung cancer staging.  Confusion.  EXAM: MRI HEAD  WITHOUT AND WITH CONTRAST  TECHNIQUE: Multiplanar, multiecho pulse sequences of the brain and surrounding structures were obtained without and with intravenous contrast.  CONTRAST:  8m MULTIHANCE GADOBENATE DIMEGLUMINE 529 MG/ML IV SOLN  COMPARISON:  07/08/2014  FINDINGS: Calvarium and upper cervical spine: No focal marrow signal abnormality.  Orbits: No significant findings.  Sinuses and Mastoids: Mucosal thickening in the bilateral mastoid air cells.  Brain: There are punctate acute infarcts throughout the bilateral cerebral hemispheric white matter and on the cortex of the bilateral occipital, parietal, and posterior frontal lobes. The largest infarct is in the left occipital white matter measuring 13 mm. No hemorrhagic conversion is noted.  Stippled enhancement on the surface of the left occipital lobe over a roughly 25 mm linear area. There is also a 5 mm area of cortical enhancement in the left parietal lobe on series 12, image 43.  Relative effacement of left sulci at the vertex which is likely from infarct related swelling.  Generalized brain atrophy with prominent mesial temporal volume loss. Chronic small vessel disease with confluent ischemic gliosis around the lateral ventricles.  Critical Value/emergent results were called by telephone at the time of interpretation on 08/09/2015 at 12:18 pm to Dr. MCurt Bears, who verbally acknowledged these results. As requested, [facility]  IMPRESSION: 1. 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 No hemorrhagic conversion. 2. Left occipital and parietal cortex enhancement is likely from subacute infarct. Will need follow-up infused brain MRI in 2 to 3 months to exclude metastatic disease in this patient with history of stage IV lung cancer.   Electronically Signed   By: JMonte FantasiaM.D.   On: 08/09/2015 12:20   Nm Pet Image Initial (pi)  Skull Base To Thigh  08/09/2015   CLINICAL DATA:  Initial treatment strategy for metastatic poorly differentiated adenocarcinoma on right supraclavicular node the 07/19/2015. Morphologic features favor GI or bronchogenic primary.  EXAM: NUCLEAR MEDICINE PET SKULL BASE TO THIGH  TECHNIQUE: 7.04 mCi F-18 FDG was injected intravenously. Full-ring PET imaging was performed from the skull base to thigh after the radiotracer. CT data was obtained and used for attenuation correction and anatomic localization.  FASTING BLOOD GLUCOSE:  Value: 96 mg/dl  COMPARISON:  Chest CT 07/18/2015.  Abdominal pelvic CT 07/19/2015.  FINDINGS: NECK  There are multiple hypermetabolic supraclavicular lymph nodes bilaterally. The largest node on the left measures 2.3 x 3.4 cm on image 38 and has an SUV max of 19.6. On the right, SUV max is 12.1. No hypermetabolic upper cervical lymph nodes demonstrated.There are no lesions of the pharyngeal mucosal space.  CHEST  There are multiple enlarged and hypermetabolic mediastinal and right hilar lymph nodes. The dominant low right paratracheal node measuring 4.4 x 2.8 cm on image 62 has an SUV max of 19.0. The lobulated right upper lobe pulmonary nodule is hypermetabolic with an SUV max of 12.6. A medially superior to the right hilum, there is additional nodular area of hypermetabolic activity, likely nodal. There is probable mild postobstructive pneumonitis  posteriorly in the right upper lobe. Biapical scarring, bibasilar atelectasis and small bilateral pleural effusions are present. There is no abnormal metabolic activity within the pleural space. There is a small pericardial effusion.  ABDOMEN/PELVIS  There is nonspecific low level hypermetabolic activity within the adrenal glands (SUV max 4.2 on the right and 3.5 on the left). There is no focal adrenal nodule, and this is favored to be physiologic. There is no abnormal metabolic activity within liver, spleen or pancreas. There is no  hypermetabolic nodal activity. Left renal cysts, diffuse atherosclerosis and ascites again noted.  SKELETON  There is no hypermetabolic activity to suggest osseous metastatic disease. Soft tissue activity posterior to the left femoral greater trochanter, likely inflammatory.  IMPRESSION: 1. Right upper lobe lung cancer with extensive hypermetabolic nodal disease to the right hilum, mediastinum and supraclavicular stations bilaterally. By PET, this is consistent with stage IIIB disease (T1a N3 M0). 2. Low-level adrenal activity bilaterally without focal nodularity, likely physiologic. 3. Similar pleural effusions and ascites. 4. Diffuse atherosclerosis.   Electronically Signed   By: Richardean Sale M.D.   On: 08/09/2015 11:12    Scheduled Meds: . atorvastatin  20 mg Oral q1800  . carvedilol  6.25 mg Oral BID WC  . feeding supplement  1 Container Oral BID BM  . pantoprazole  40 mg Oral Daily   Continuous Infusions: . heparin 950 Units/hr (08/10/15 1034)   Antibiotics Given (last 72 hours)    None      Active Problems:   PUD (peptic ulcer disease)   LBBB (left bundle branch block)   Dementia with behavioral disturbance   PE (pulmonary embolism)   Protein-calorie malnutrition, severe   Non-small cell carcinoma of lung, stage 4   CVA (cerebral infarction)   Stroke    Time spent: 25 min    Leotta Weingarten  [facility] Pager [phone]. If 7PM-7AM, please contact night-coverage at [email or web address], password [facility] 08/10/2015, 12:21 PM  LOS: 1 day

## 2015-08-10 NOTE — Progress Notes (Signed)
ANTICOAGULATION CONSULT NOTE - Follow Up Consult  Pharmacy Consult for Heparin Indication: new CVA, h/o PE  No Known Allergies  Patient Measurements: Height: '5\' 7"'$  (170.2 cm) Weight: 139 lb 1.8 oz (63.1 kg) IBW/kg (Calculated) : 66.1 Heparin Dosing Weight: 63 kg  Vital Signs: Temp: 99.5 F (37.5 C) (08/25 0800) Temp Source: Oral (08/25 0800) BP: 146/88 mmHg (08/25 0800) Pulse Rate: 84 (08/25 0800)  Labs:  Recent Labs  08/09/15 1520 08/09/15 2205 08/10/15 0325 08/10/15 0816  HGB 8.1*  --   --  7.8*  HCT 25.1*  --   --  24.2*  PLT 301  --   --  287  HEPARINUNFRC  --   --   --  <0.10*  CREATININE 0.69  --   --   --   TROPONINI 0.10* 0.20* 0.24*  --     Estimated Creatinine Clearance: 73.4 mL/min (by C-G formula based on Cr of 0.69).  Assessment:  Abnormal MRI  Anticoagulation: full-dose Lovenox from PTA for hx PE (last dose 8/23 at 0700), New dx lung cancer. Admitted with new CVA, MRI negative for hemorrhagic conversion >> change to IV heparin for ease of reversal if hemorrhagic conversion occurs, unsure whether it is considered Lovenox failure, may need BID dosing at discharge. Heparin level >0.1. Hgb low at 7.8  Goal of Therapy:  0.3-0.5 Monitor platelets by anticoagulation protocol: Yes   Plan:  Increase IV heparin to 950 units/hr Recheck HL in 6 hrs. Daily HL and CBC    Jared Rogers, PharmD, BCPS Clinical Staff Pharmacist Pager [phone]  Jared Rogers 08/10/2015,10:12 AM

## 2015-08-10 NOTE — Progress Notes (Signed)
OT Cancellation Note  Patient Details Name: Jared Rogers MRN: [id number] DOB: 1941/07/14   Cancelled Treatment:    Reason Eval/Treat Not Completed: Patient at procedure or test/ unavailable  Pt off the unit, will follow up as able.  Simonne Come 08/10/2015, 11:04 AM

## 2015-08-10 NOTE — Evaluation (Signed)
Occupational Therapy Evaluation Patient Details Name: Jared Rogers MRN: [id number] DOB: 1940/12/28 Today's Date: 08/10/2015    History of Present Illness 74 year old gentleman with a past medical history of tobacco abuse, DDD, stroke admitted on 07/18/2015 with complaints of difficulty breathing. CT scan of lungs revealed a 17 x 20 mm right upper lobe nodule associated with malignant-appearing adenopathy in the mediastinum and lower neck. Evidence of a pulmonary embolism as well as DVTs. Interventional radiology was consulted for ultrasound-guided supraclavicular lymph node biopsy that was performed on 07/19/2015.   Clinical Impression   Pt admitted with above impacting his ability to engage in and complete ADLs.  Mod assist with bed mobility and sit > stand, utilized +2 for standing for balance and safety.  Pt unwilling to complete ambulation or transfers.  Doffed Rt sock with increased time and min assist for dynamic sitting balance.  Required increased time and physical assist to don sock secondary to decreased sitting balance and possible visual perceptual deficits.  Pt will benefit from skilled OT to address deficits below and increase functional mobility and participation in self-care tasks.      Follow Up Recommendations  Supervision/Assistance - 24 hour;SNF    Equipment Recommendations  None recommended by OT (TBD at next venue of care)    Recommendations for Other Services       Precautions / Restrictions Precautions Precautions: Fall Restrictions Weight Bearing Restrictions: No      Mobility Bed Mobility Overal bed mobility: Needs Assistance                Transfers Overall transfer level: Needs assistance Equipment used: Rolling walker (2 wheeled) Transfers: Sit to/from Stand Sit to Stand: Mod assist         General transfer comment: Visual and tactile cues for hand placement and initiation to standing    Balance Overall balance assessment: Needs  assistance Sitting-balance support: Feet supported Sitting balance-Leahy Scale: Poor Sitting balance - Comments: dynamic sitting required min assist to supervision intermittently throughout task (donning/doffing socks)   Standing balance support: Bilateral upper extremity supported Standing balance-Leahy Scale: Poor Standing balance comment: required assist for maintaining stability with use of RW, able to weight shift in place with hands on assist (moderate)                            ADL Overall ADL's : Needs assistance/impaired                     Lower Body Dressing: Moderate assistance Lower Body Dressing Details (indicate cue type and reason): only doffed and donned Rt sock, noted visual perceptual deficits when attempting to don Rt sock requiring assist               General ADL Comments: Unable to complete ambulation or transfers with pt as he would report "no bueno" with attempts.  Did stand at EOB with mod assist of 2 for safety     Vision Additional Comments: Unable to assess secondary to no interpreter present   Perception Perception Comments: No formal visual assessment, however suspect visual perceptual deficit as he would undershoot when attempting to don socks       Pertinent Vitals/Pain Pain Assessment: No/denies pain     Hand Dominance Right   Extremity/Trunk Assessment Upper Extremity Assessment Upper Extremity Assessment: Generalized weakness (grossly 4- to 4/5)           Communication Communication Communication:  Prefers language other than English   Cognition Arousal/Alertness: Awake/alert Behavior During Therapy: WFL for tasks assessed/performed Overall Cognitive Status: No family/caregiver present to determine baseline cognitive functioning Area of Impairment:  (Difficult to assess secondary to non-English speaker)                              Home Living Family/patient expects to be discharged to:: Private  residence Living Arrangements: Children Available Help at Discharge: Family Type of Home: [place]: Two level;Bed/bath upstairs Alternate Level Stairs-Number of Steps: flight   Bathroom Shower/Tub: Teacher, early years/pre: Handicapped height     Home Equipment: Environmental consultant - 2 wheels   Additional Comments: information taken from chart review from admission on 8/5 due to non-English speaker and no Interpreter or family present.       Prior Functioning/Environment          Comments: per chart review on 8/5 patient was independent prior to last admission    OT Diagnosis: Generalized weakness;Disturbance of vision   OT Problem List: Decreased strength;Decreased activity tolerance;Impaired balance (sitting and/or standing);Decreased knowledge of use of DME or AE;Pain;Impaired vision/perception   OT Treatment/Interventions: Self-care/ADL training;DME and/or AE instruction;Patient/family education;Balance training    OT Goals(Current goals can be found in the care plan section) Acute Rehab OT Goals Patient Stated Goal: none stated; agreeable to OT evaluation Time For Goal Achievement: 08/24/15 Potential to Achieve Goals: Good  OT Frequency: Min 2X/week           Co-evaluation PT/OT/SLP Co-Evaluation/Treatment: Yes Reason for Co-Treatment: Complexity of the patient's impairments (multi-system involvement)   OT goals addressed during session: ADL's and self-care      End of Session Equipment Utilized During Treatment: Rolling walker Nurse Communication: Mobility status  Activity Tolerance: Patient tolerated treatment well;Patient limited by fatigue Patient left: in bed;with call bell/phone within reach;with bed alarm set   Time: 1425-1444 OT Time Calculation (min): 19 min Charges:  OT General Charges $OT Visit: 1 Procedure OT Evaluation $Initial OT Evaluation Tier I: 1 Procedure G-CodesSimonne Come, [id number] 08/10/2015, 4:00 PM

## 2015-08-10 NOTE — Progress Notes (Signed)
PT Cancellation Note  Patient Details Name: Jared Rogers MRN: [id number] DOB: 1941/10/31   Cancelled Treatment:    Reason Eval/Treat Not Completed: Patient at procedure or test/unavailable   Duncan Dull 08/10/2015, 11:12 AM Alben Deeds, PT DPT  [phone]

## 2015-08-10 NOTE — Progress Notes (Signed)
ANTICOAGULATION CONSULT NOTE - Follow Up Consult  Pharmacy Consult for Heparin Indication: new CVA, h/o PE  No Known Allergies  Patient Measurements: Height: '5\' 7"'$  (170.2 cm) Weight: 139 lb 1.8 oz (63.1 kg) IBW/kg (Calculated) : 66.1 Heparin Dosing Weight: 63 kg  Vital Signs: Temp: 99.7 F (37.6 C) (08/25 1440) Temp Source: Oral (08/25 1440) BP: 136/72 mmHg (08/25 1440) Pulse Rate: 79 (08/25 1440)  Labs:  Recent Labs  08/09/15 1520 08/09/15 2205 08/10/15 0325 08/10/15 0816 08/10/15 1610  HGB 8.1*  --   --  7.8*  --   HCT 25.1*  --   --  24.2*  --   PLT 301  --   --  287  --   HEPARINUNFRC  --   --   --  <0.10* <0.10*  CREATININE 0.69  --   --   --   --   TROPONINI 0.10* 0.20* 0.24*  --   --     Estimated Creatinine Clearance: 73.4 mL/min (by C-G formula based on Cr of 0.69).  Assessment:  Abnormal MRI  Anticoagulation: full-dose Lovenox from PTA for hx PE (last dose 8/23 at 0700), New dx lung cancer. Admitted with new CVA, MRI negative for hemorrhagic conversion >> change to IV heparin for ease of reversal if hemorrhagic conversion occurs, unsure whether it is considered Lovenox failure, may need BID dosing at discharge.  Heparin level < 0.1 units/mL after rate increase to 950 units/hr.  Goal of Therapy:  0.3-0.5 units/mL Monitor platelets by anticoagulation protocol: Yes   Plan:  Increase IV heparin to 1150 units/hr Recheck HL in 8 hrs Monitor daily HL, CBC, and s/sx of bleeding F/u anticoag plans for discharge   Drucie Opitz, PharmD Clinical Pharmacist Pager: [phone] 08/10/2015 5:14 PM

## 2015-08-10 NOTE — Progress Notes (Signed)
  Echocardiogram 2D Echocardiogram has been performed.  Jared Rogers 08/10/2015, 10:01 AM

## 2015-08-10 NOTE — Progress Notes (Signed)
VASCULAR LAB PRELIMINARY  PRELIMINARY  PRELIMINARY  PRELIMINARY  Carotid duplex  completed.    Preliminary report:  Bilateral:  1-39% ICA stenosis.  Vertebral artery flow is antegrade.      Kaidan Spengler, RVT 08/10/2015, 12:16 PM

## 2015-08-10 NOTE — Progress Notes (Addendum)
STROKE TEAM PROGRESS NOTE   HISTORY Jared Rogers is a 74 y.o. 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 Family reports no acute change in function or mental status. They report ongoing weight loss, generalized weakness and poor PO intake.   MRI brain imaging reviewed, shows numerous small acute infarcts in the bilateral cerebral hemisphere.   Date last known well: unclear Time last known well: unclear tPA Given: no, unclear LSW   SUBJECTIVE (INTERVAL HISTORY) No family members present. The [place] is [place] He speaks limited [place]. No interpreter available at the bedside   OBJECTIVE Temp:  [98.2 F (36.8 C)-99.1 F (37.3 C)] 99.1 F (37.3 C) (08/25 0600) Pulse Rate:  [59-83] 83 (08/25 0600) Cardiac Rhythm:  [-] Normal sinus rhythm;Bundle branch block (08/24 1900) Resp:  [15-24] 18 (08/25 0600) BP: (135-163)/(65-95) 135/68 mmHg (08/25 0600) SpO2:  [95 %-100 %] 96 % (08/25 0600) Weight:  [63.1 kg (139 lb 1.8 oz)] 63.1 kg (139 lb 1.8 oz) (08/24 2200)   Recent Labs Lab 08/09/15 0914  GLUCAP 96    Recent Labs Lab 08/09/15 1520  NA 136  K 3.9  CL 105  CO2 20*  GLUCOSE 86  BUN 13  CREATININE 0.69  CALCIUM 8.4*    Recent Labs Lab 08/09/15 1520  AST 25  ALT 23  ALKPHOS 89  BILITOT 0.7  PROT 7.4  ALBUMIN 1.6*    Recent Labs Lab 08/09/15 1520  WBC 10.3  NEUTROABS 6.9  HGB 8.1*  HCT 25.1*  MCV 79.2  PLT 301    Recent Labs Lab 08/09/15 1520 08/09/15 2205 08/10/15 0325  TROPONINI 0.10* 0.20* 0.24*   No results for input(s): LABPROT, INR in the last 72 hours.  Recent Labs  08/09/15 1745  COLORURINE AMBER*  LABSPEC 1.030  PHURINE 6.0  GLUCOSEU NEGATIVE  HGBUR NEGATIVE  BILIRUBINUR SMALL*  KETONESUR 15*  PROTEINUR 30*  UROBILINOGEN 2.0*  NITRITE NEGATIVE  LEUKOCYTESUR  NEGATIVE       Component Value Date/Time   CHOL 126 08/10/2015 0325   TRIG 40 08/10/2015 0325   HDL 25* 08/10/2015 0325   CHOLHDL 5.0 08/10/2015 0325   VLDL 8 08/10/2015 0325   LDLCALC 93 08/10/2015 0325   Lab Results  Component Value Date   HGBA1C 5.1 07/18/2015   No results found for: LABOPIA, COCAINSCRNUR, LABBENZ, AMPHETMU, THCU, LABBARB  No results for input(s): ETH in the last 168 hours.   IMAGING  Mr Kizzie Fantasia Contrast 08/09/2015    1. 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 No hemorrhagic conversion.  2. Left occipital and parietal cortex enhancement is likely from subacute infarct.  Will need follow-up infused brain MRI in 2 to 3 months to exclude metastatic disease in this patient with history of stage IV lung cancer.        Nm Pet Image Initial (pi) Skull Base To Thigh 08/09/2015    1. Right upper lobe lung cancer with extensive hypermetabolic nodal disease to the right hilum, mediastinum and supraclavicular stations bilaterally. By PET, this is consistent with stage IIIB disease (T1a N3 M0).  2. Low-level adrenal activity bilaterally without focal nodularity, likely physiologic.  3. Similar pleural effusions and ascites.  4. Diffuse atherosclerosis.        PHYSICAL EXAM Frail elderly Hispanic male  currently not in distress. . Afebrile. Head is nontraumatic. Neck is supple without bruit.    Cardiac exam no murmur or gallop. Lungs are clear to auscultation. Distal pulses are well felt.  Neurological Exam :  Exam is limited due to patient's limited knowledge of English and absence of any interpreter at the bedside. He is awake alert interactive. Follows simple commands. No dysarthria or aphasia. Extraocular movements are full range without nystagmus. Blinks to threat bilaterally. Fundi could not be visualized. Vision acuity seems adequate. Face is symmetric without weakness. Tongue is midline. Motor  system exam able to lift all 4 extremities against gravity without drift. No focal weakness. Deep tendon reflexes are 1+ symmetric. Plantars are downgoing. Sensation appears preserved bilaterally. Coordination and gait cannot be tested reliably.   ASSESSMENT/PLAN [name] is a 74 y.o. male with history of lung cancer, hypertension, ongoing tobacco use, previous stroke, hyperlipidemia, alcohol abuse, DVT, pulmonary embolus, and migraine headaches presenting with an MRI showing bilateral cerebral hemisphere infarcts. He did not receive IV t-PA due to unknown time of onset.   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  Resultant minimal deficits  MRI  Numerous small acute infarcts in the bilateral cerebral hemisphere compatible with central embolic disease  MRA pending  Carotid Doppler Bilateral: 1-39% ICA stenosis. Vertebral artery flow is antegrade.   2D Echo pending  LDL 93  HgbA1c pending  Intravenous heparin for VTE prophylaxis  Diet Heart Room service appropriate?: Yes; Fluid consistency:: Thin dementia,  Lovenox prior to admission, now on heparin  Patient counseled to be compliant with his antithrombotic medications  Ongoing aggressive stroke risk factor management  Therapy recommendations: Pending  Disposition:  Pending  Hypertension  Home meds: Carvedilol and lisinopril  Stable   Hyperlipidemia  Home meds:  No lipid lowering medications prior to admission.  LDL 93, goal < 70  Add Lipitor 20 mg daily  Continue statin at discharge  Diabetes  HgbA1c pending, goal < 7.0  No hx of DM  Other Stroke Risk Factors  Advanced age  Cigarette smoker, advised to stop smoking  Lung cancer  Other Active Problems  Anemia  Elevated troponin enzymes  Other Pertinent History  History of DVT and pulmonary embolus  History of dementia  Hospital day # 1  PLAN  Await 2-D echo  No TEE planned  Dr. Leonie Man will  try to contact the patient's family  IV heparin per pharmacy - question warfarin therapy.   Mikey Bussing PA-C [facility] Pager [phone] 08/10/2015, 11:32 AM I have personally examined this patient, reviewed notes, independently viewed imaging studies, participated in medical decision making and plan of care. I have made any additions or clarifications directly to the above note. Agree with note above.  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 I'm not sure how aggressive we need to be in terms of his stroke workup her anticoagulation given his poor prognosis. I will try to speak to his family members and decide.  Antony Contras, MD Medical Director [facility] Pager: [phone] 08/10/2015 1:03 PM   Addendum : I spoke to the patient's sister-in-law Amy Kirk Ruths  over the phone and discussed his prognosis, strokes, possible mechanisms and she was of the opinion that the family would be leaning towards comfort care and hence doing further stroke workup is not necessary. She will speak with the patient's daughter who is her health power of attorney and try to change that to herself since she is more involved in the patient's care Antony Contras, MD  08/10/15 117 pm To contact Stroke Continuity provider, please refer to [email or web address] After hours, contact General Neurology

## 2015-08-10 NOTE — Evaluation (Signed)
Physical Therapy Evaluation Patient Details Name: Jared Rogers MRN: [id number] DOB: 06/05/1941 Today's Date: 08/10/2015   History of Present Illness  74 y.o. male hx of PE (on Lovenox), dementia, recent diagnosis of lung cancer (Stage IV non-small cell) sent to the ED after MRI brain showed multiple small infarcts bilateral cerebral hemisphere  Clinical Impression  Patient demonstrates deficits in functional mobility as indicated below. Will need continued skilled PT to address deficits and maximize function. Will see as indicated and progress as tolerated. OF NOTE: patient with multiple admissions within the month, history and home environment information taken from admission on 8/5 (no family at bedside during this assessment). If declining SNF, will need to consider w/c for home mobility and possible use of lift equipment pending physical ability of caregivers.    Follow Up Recommendations SNF;Supervision/Assistance - 24 hour    Equipment Recommendations  Wheelchair (measurements PT);Wheelchair cushion (measurements PT)    Recommendations for Other Services  (recommend paliative consult)     Precautions / Restrictions Precautions Precautions: Fall Restrictions Weight Bearing Restrictions: No      Mobility  Bed Mobility Overal bed mobility: Needs Assistance Bed Mobility: Rolling;Supine to Sit;Sit to Supine Rolling: Min assist   Supine to sit: Mod assist Sit to supine: Min assist   General bed mobility comments: moderate assist to pull trunk to elevate to EOB with assist of chuck pad to rotate hips to EOB  Transfers Overall transfer level: Needs assistance Equipment used: Rolling walker (2 wheeled) Transfers: Sit to/from Stand Sit to Stand: Mod assist         General transfer comment: Visual and tactile cues for hand placement and initiation to standing  Ambulation/Gait             General Gait Details: patient unable to perform this session, ('no  bueno')  Stairs            Wheelchair Mobility    Modified Rankin (Stroke Patients Only)       Balance Overall balance assessment: Needs assistance Sitting-balance support: Feet supported Sitting balance-Leahy Scale: Poor Sitting balance - Comments: dynamic sitting required min assist to supervision intermittently throughout task (donning/doffing socks)   Standing balance support: Bilateral upper extremity supported Standing balance-Leahy Scale: Poor Standing balance comment: required assist for maintaining stability with use of RW, able to weight shift in place with hands on assist (moderate)                             Pertinent Vitals/Pain Pain Assessment: No/denies pain    Home Living Family/patient expects to be discharged to:: Private residence Living Arrangements: Children Available Help at Discharge: Family Type of Home: [place]: Two level;Bed/bath upstairs Home Equipment: Environmental consultant - 2 wheels Additional Comments: history and PLOF per chart review taken from admission on 8/5    Prior Function           Comments: per chart review on 8/5 patient was independent prior to last admission     Hand Dominance   Dominant Hand: Right    Extremity/Trunk Assessment   Upper Extremity Assessment: Generalized weakness (grossly 4- to 4/5)           Lower Extremity Assessment: Generalized weakness (limited ROM noted with active movement)         Communication   Communication: Prefers language other than English  Cognition Arousal/Alertness: Awake/alert Behavior During Therapy: [facility] for  tasks assessed/performed Overall Cognitive Status: No family/caregiver present to determine baseline cognitive functioning Area of Impairment:  (Difficult to assess secondary to non-English speaker)                    General Comments      Exercises        Assessment/Plan    PT Assessment Patient needs continued PT services   PT Diagnosis Difficulty walking;Generalized weakness   PT Problem List Decreased strength;Decreased activity tolerance;Decreased mobility;Decreased balance;Decreased knowledge of use of DME  PT Treatment Interventions DME instruction;Gait training;Stair training;Functional mobility training;Patient/family education;Therapeutic activities;Therapeutic exercise   PT Goals (Current goals can be found in the Care Plan section) Acute Rehab PT Goals Patient Stated Goal: none stated; agreeable to OT evaluation PT Goal Formulation: With patient Time For Goal Achievement: 07/27/15 Potential to Achieve Goals: Fair    Frequency Min 2X/week   Barriers to discharge        Co-evaluation PT/OT/SLP Co-Evaluation/Treatment: Yes Reason for Co-Treatment: Complexity of the patient's impairments (multi-system involvement)   OT goals addressed during session: ADL's and self-care       End of Session Equipment Utilized During Treatment: Gait belt Activity Tolerance: Patient tolerated treatment well Patient left: in bed;with call bell/phone within reach;with bed alarm set Nurse Communication: Mobility status         Time: 3818-2993 PT Time Calculation (min) (ACUTE ONLY): 19 min   Charges:   PT Evaluation $Initial PT Evaluation Tier I: 1 Procedure     PT G CodesDuncan Dull 08-19-2015, 4:01 PM Alben Deeds, [place] DPT  [phone]

## 2015-08-11 ENCOUNTER — Ambulatory Visit: Payer: PPO | Admitting: Physician Assistant

## 2015-08-11 DIAGNOSIS — I638 Other cerebral infarction: Secondary | ICD-10-CM

## 2015-08-11 LAB — CBC
HCT: 21.8 % — ABNORMAL LOW (ref 39.0–52.0)
Hemoglobin: 7.2 g/dL — ABNORMAL LOW (ref 13.0–17.0)
MCH: 25.9 pg — AB (ref 26.0–34.0)
MCHC: 33 g/dL (ref 30.0–36.0)
MCV: 78.4 fL (ref 78.0–100.0)
PLATELETS: 289 10*3/uL (ref 150–400)
RBC: 2.78 MIL/uL — ABNORMAL LOW (ref 4.22–5.81)
RDW: 18.1 % — AB (ref 11.5–15.5)
WBC: 10 10*3/uL (ref 4.0–10.5)

## 2015-08-11 LAB — HEPARIN LEVEL (UNFRACTIONATED)
Heparin Unfractionated: <0.1 IU/mL — ABNORMAL LOW (ref 0.30–0.70)
Heparin Unfractionated: <0.1 IU/mL — ABNORMAL LOW (ref 0.30–0.70)

## 2015-08-11 LAB — HEMOGLOBIN A1C
Hgb A1c MFr Bld: 6 % — ABNORMAL HIGH (ref 4.8–5.6)
MEAN PLASMA GLUCOSE: 126 mg/dL

## 2015-08-11 MED ORDER — ATORVASTATIN CALCIUM 20 MG PO TABS: 20.0000 mg | ORAL_TABLET | Freq: Every day | ORAL | Status: DC

## 2015-08-11 MED ORDER — ENOXAPARIN SODIUM 60 MG/0.6ML ~~LOC~~ SOLN
60.0000 mg | Freq: Two times a day (BID) | SUBCUTANEOUS | Status: DC
  Administered 2015-08-11: 60 mg via SUBCUTANEOUS
  Filled 2015-08-11 (×2): qty 0.6

## 2015-08-11 MED ORDER — ENOXAPARIN SODIUM 60 MG/0.6ML ~~LOC~~ SOLN: 60.0000 mg | Freq: Two times a day (BID) | SUBCUTANEOUS | Status: DC

## 2015-08-11 NOTE — Clinical Social Work Placement (Signed)
   CLINICAL SOCIAL WORK PLACEMENT  NOTE  Date:  08/11/2015  Patient Details  Name: Jared Rogers MRN: [id number] Date of Birth: 06/30/1941  Clinical Social Work is seeking post-discharge placement for this patient at the [place] level of care (*CSW will initial, date and re-position this form in  chart as items are completed):  Yes   Patient/family provided with [place] Work Department's list of facilities offering this level of care within the geographic area requested by the patient (or if unable, by the patient's family).  Yes   Patient/family informed of their freedom to choose among providers that offer the needed level of care, that participate in Medicare, Medicaid or managed care program needed by the patient, have an available bed and are willing to accept the patient.  Yes   Patient/family informed of [facility]'s ownership interest in [facility] and [facility], as well as of the fact that they are under no obligation to receive care at these facilities.  PASRR submitted to EDS on 08/11/15     PASRR number received on 08/11/15     Existing PASRR number confirmed on       FL2 transmitted to all facilities in geographic area requested by pt/family on       FL2 transmitted to all facilities within larger geographic area on       Patient informed that his/her managed care company has contracts with or will negotiate with certain facilities, including the following:        Yes   Patient/family informed of bed offers received.  Patient chooses bed at [facility]     Physician recommends and patient chooses bed at      Patient to be transferred to [facility] on 08/11/15.  Patient to be transferred to facility by [facility]      Patient family notified on 08/11/15 of transfer.  Name of family member notified:  Edwin Dada, daughter-in-law      PHYSICIAN       Additional Comment:     _______________________________________________ Greta Doom, LCSW 08/11/2015, 3:29 PM

## 2015-08-11 NOTE — Clinical Social Work Note (Signed)
CSW spoke with the pt's daughter-in-law Amy regarding SNF placement. Amy denied SNF placement. Amy reported that she will take the pt's home and resume home health services with [facility]. CSW informed the case manager and MD. At this time CSW will sign off.   [place], MSW, [place]

## 2015-08-11 NOTE — Care Management Note (Signed)
Case Management Note  Patient Details  Name: [name] MRN: [id number] Date of Birth: Feb 04, 1941  Subjective/Objective:                    Action/Plan: Patient's discharge disposition has changed to SNF.  Miranda with [facility] was notified that patient will not be discharging home with home health services at this time.  Expected Discharge Date:                  Expected Discharge Plan:  Skilled Nursing Facility  In-House Referral:  Clinical Social Work  Discharge planning Services  CM Consult  Post Acute Care Choice:    Choice offered to:     DME Arranged:    DME Agency:     HH Arranged:    [place] Agency:     Status of Service:  Completed, signed off  Medicare Important Message Given:    Date Medicare IM Given:    Medicare IM give by:    Date Additional Medicare IM Given:    Additional Medicare Important Message give by:     If discussed at [place] of Stay Meetings, dates discussed:    Additional Comments:  Rolm Baptise, RN 08/11/2015, 3:29 PM

## 2015-08-11 NOTE — Progress Notes (Signed)
ANTICOAGULATION CONSULT NOTE - Follow Up Consult  Pharmacy Consult for Heparin Indication: new CVA, h/o PE  No Known Allergies  Patient Measurements: Height: '5\' 7"'$  (170.2 cm) Weight: 139 lb 1.8 oz (63.1 kg) IBW/kg (Calculated) : 66.1 Heparin Dosing Weight: 63 kg  Vital Signs: Temp: 98.7 F (37.1 C) (08/26 0139) Temp Source: Oral (08/26 0139) BP: 142/69 mmHg (08/26 0139) Pulse Rate: 79 (08/26 0139)  Labs:  Recent Labs  08/09/15 1520 08/09/15 2205 08/10/15 0325 08/10/15 0816 08/10/15 1610 08/11/15 0125  HGB 8.1*  --   --  7.8*  --  7.2*  HCT 25.1*  --   --  24.2*  --  21.8*  PLT 301  --   --  287  --  289  HEPARINUNFRC  --   --   --  <0.10* <0.10* <0.10*  CREATININE 0.69  --   --   --   --   --   TROPONINI 0.10* 0.20* 0.24*  --   --   --     Estimated Creatinine Clearance: 73.4 mL/min (by C-G formula based on Cr of 0.69).  Assessment: Pt on full-dose Lovenox from PTA for hx PE (last dose 8/23 at 0700), New dx lung cancer. Admitted with new CVA, MRI negative for hemorrhagic conversion. Lovenox changed to IV heparin for ease of reversal if hemorrhagic conversion occurs, unsure whether it is considered Lovenox failure, may need BID dosing at discharge.  Heparin level remains undetectable (< 0.1) on 1150 units/hr. No issues with line or bleeding per RN.  Goal of Therapy:  Heparin level0.3-0.5 units/mL Monitor platelets by anticoagulation protocol: Yes   Plan:  Increase IV heparin to 1400 units/hr Recheck heparin level in 8 hrs  Sherlon Handing, PharmD, BCPS Clinical pharmacist, pager [phone] 08/11/2015 3:01 AM

## 2015-08-11 NOTE — Progress Notes (Signed)
Dr. Eliseo Squires and Dr. Leonie Man in to speak with patient. Spero Geralds, Interpreter services for [employer], present.

## 2015-08-11 NOTE — Discharge Summary (Addendum)
Physician Discharge Summary  Jared Rogers TUU:[id number] DOB: [birth year] DOA: 08/09/2015  PCP: Jared Calico, MD  Admit date: 08/09/2015 Discharge date: 08/11/2015  Time spent: 35 minutes  Recommendations for Outpatient Follow-up:  1. To SNF 2. Follow up with Dr. Inda Rogers re treatment plan= would consider hospice/pallaitive care 3. Cbc 1 week- watch for bleeding 4. DNR  Discharge Diagnoses:  Active Problems:   PUD (peptic ulcer disease)   LBBB (left bundle branch block)   Dementia with behavioral disturbance   PE (pulmonary embolism)   Protein-calorie malnutrition, severe   Non-small cell carcinoma of lung, stage 4   CVA (cerebral infarction)   Stroke   Elevated troponin I level   Discharge Condition: stable  Diet recommendation: cardiac  Filed Weights   08/09/15 2200  Weight: 63.1 kg (139 lb 1.8 oz)    History of present illness:  Jared Rogers is a 74 y.o. male with stage IV non-small cell lung cancer, recently diagnosed is undergoing staging workup, history of pulmonary embolism on SQ Lovenox, dementia, peptic ulcer disease, severe protein calorie malnutrition was sent to the ER from [facility] radiology due to abnormal MRI brain which revealed acute infarcts. History is provided by patient's son at bedside, he denies any recent change in his function at baseline, her son just reports ongoing weight loss, generalized weakness, and poor by mouth intake recently . They deny any facial droop any speech disturbance or swallowing problems.  MRI brain showed small acute infarcts and old prior infarcts, no evidence of brain metastasis , this report was called to Dr. Inda Rogers that the cancer center who directed him to [facility] emergency room   [facility] Course:  CVA (cerebral infarction) -neuro consult- suspect CVA related to hypercoagulable  2-D echocardiogram, carotid duplex: Bilateral: 1-39% ICA stenosis. Vertebral artery flow is antegrade. SNF -FU LDL: 93-  add statin Hbaic: 6  H/o PE -change lovenox to 60 BID, not 100 daily  Minimally elevated troponin -Denies any chest pain EKG with left bundle unchanged -flat troponin- no further work up  PUD -continue PPI   Protein-calorie malnutrition, severe -Continue supplements   Non-small cell carcinoma of lung, stage 3B -Followed by Dr. Julien Rogers, he is aware of admission-- still in planning stage for treatment  Anemia of chronic disease  -Chronic -add Fe back  Procedures:  Echo: - Left ventricle: The cavity size was mildly dilated. Systolic function was mildly to moderately reduced. The estimated ejection fraction was in the range of 40% to 45%. Mild diffuse hypokinesis. Although no diagnostic regional wall motion abnormality was identified, this possibility cannot be completely excluded on the basis of this study. - Ventricular septum: Septal motion showed mild dyssynergy. These changes are consistent with a left bundle branch block. - Mitral valve: There was mild regurgitation. - Left atrium: The atrium was mildly dilated. - Pericardium, extracardiac: A small pericardial effusion was identified circumferential to the heart.  Consultations:  neuro  Discharge Exam: Filed Vitals:   08/11/15 1336  BP: 133/66  Pulse: 82  Temp: 99.5 F (37.5 C)  Resp: 20     Discharge Instructions   Discharge Instructions    Diet - low sodium heart healthy    Complete by:  As directed      Discharge instructions    Complete by:  As directed   Cbc 1 week re Hgb     Increase activity slowly    Complete by:  As directed  Current Discharge Medication List    START taking these medications   Details  atorvastatin (LIPITOR) 20 MG tablet Take 1 tablet (20 mg total) by mouth daily at 6 PM.      CONTINUE these medications which have CHANGED   Details  enoxaparin (LOVENOX) 60 MG/0.6ML injection Inject 0.6 mLs (60 mg total) into the skin 2 (two) times  daily. Qty: 0 Syringe      CONTINUE these medications which have NOT CHANGED   Details  carvedilol (COREG) 6.25 MG tablet Take 1 tablet (6.25 mg total) by mouth 2 (two) times daily with a meal. Qty: 60 tablet, Refills: 1    feeding supplement (BOOST / RESOURCE BREEZE) LIQD Take 1 Container by mouth 2 (two) times daily between meals. Qty: 20 Container, Refills: 0    omeprazole (PRILOSEC) 40 MG capsule Take 1 capsule (40 mg total) by mouth daily. Qty: 30 capsule, Refills: 3    FeAsp-B12-FA-C-DSS-SuccAc-Zn (FERIVA 21/7) 75-1 MG TABS Take 1 tablet by mouth daily. Qty: 28 tablet, Refills: 11   Associated Diagnoses: Iron deficiency anemia      STOP taking these medications     lisinopril (PRINIVIL,ZESTRIL) 10 MG tablet        No Known Allergies Follow-up Information    Follow up with Jared Calico, MD In 1 week.   Specialty:  Internal Medicine   Contact information:   [address] [address] [phone]        The results of significant diagnostics from this hospitalization (including imaging, microbiology, ancillary and laboratory) are listed below for reference.    Significant Diagnostic Studies: Dg Chest 2 View  07/13/2015   CLINICAL DATA:  Abdominal pain.  Unintentional weight loss.  EXAM: CHEST - 2 VIEW  COMPARISON:  None.  FINDINGS: Heart size is normal. Lungs are hyperexpanded, suggesting emphysema. Nodular density over the right chest likely represents a nipple shadow additional nodule is seen in the right upper lobe measuring at 2.3 cm maximally. The left lung is clear. Atherosclerotic calcifications are present at the aortic arch. The visualized soft tissues and bony thorax are unremarkable.  IMPRESSION: 1. 2 cm nodular density at the right upper lobe. This is concerning for a primary lung neoplasm. Recommend CT of the chest with contrast for further evaluation. 2. Additional lower nodular density likely represents a nipple shadow but can also  be evaluated by CT. 3. Emphysema. These results will be called to the ordering clinician or representative by the Radiologist Assistant, and communication documented in the PACS or zVision Dashboard.   Electronically Signed   By: San Morelle M.D.   On: 07/13/2015 10:12   Ct Chest W Contrast  07/18/2015   ADDENDUM REPORT: 07/18/2015 10:21  ADDENDUM: Critical Value/emergent results were called by telephone at the time of interpretation on 07/18/2015 at 10:21 am to Dr. Lynne Leader , who verbally acknowledged these results.   Electronically Signed   By: Franchot Gallo M.D.   On: 07/18/2015 10:21   07/18/2015   CLINICAL DATA:  Lung nodule.  Weight loss.  EXAM: CT CHEST WITH CONTRAST  TECHNIQUE: Multidetector CT imaging of the chest was performed during intravenous contrast administration.  CONTRAST:  68m OMNIPAQUE IOHEXOL 300 MG/ML  SOLN  COMPARISON:  Chest x-ray 07/13/2015  FINDINGS: Right upper lobe nodule measures 17 x 20 mm. Soft tissue density without calcification.  Mediastinal adenopathy. Right peritracheal lymph node measures 27 mm. Left lower peritracheal lymph node 15 mm. Right upper paratracheal  lymph nodes measuring approximately 15 mm. Enlarged lymph nodes in the lower neck bilaterally. These lymph nodes are consistent with metastatic disease. Findings are most consistent with carcinoma of the lung. Subcentimeter lymph nodes right hilum, possible tumor involvement.  Filling defect right lower lobe pulmonary artery in the medial base with branching in the smaller vessels, consistent with pulmonary embolism. No other filling defects identified.  Patchy airspace disease in the right posterior lung base and in the right posterior lung apex. Small right effusion. These findings are most likely related to pneumonia with tumor considered less likely. Left lung is clear.  Apical scarring bilaterally.  No skeletal lesions.  Moderate pericardial effusion. Malignancy is possible in the pericardium. Coronary  artery calcification.  Upper abdomen reveals no mass lesion.  IMPRESSION: 17 x 20 mm right upper lobe nodule. There is malignant appearing adenopathy in the mediastinum and lower neck, consistent with metastatic disease. 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  Pericardial effusion, possibly due to metastatic disease.  Right upper lobe infiltrate and right lower lobe infiltrate, suspicious for pneumonia. Small right effusion.  Right lower lobe isolated pulmonary embolism.  Electronically Signed: By: Franchot Gallo M.D. On: 07/18/2015 10:15   Mr Angiogram Head Wo Contrast  08/10/2015   CLINICAL DATA:  Follow up stroke suggesting embolic disease.  EXAM: MRA HEAD WITHOUT CONTRAST  TECHNIQUE: Angiographic images of the Circle of Willis were obtained using MRA technique without intravenous contrast.  COMPARISON:  Brain MRI from yesterday  FINDINGS: Symmetric vertebral arteries, widely patent into the patent basilar. Left PICA and dominant right AICA. Symmetric superior cerebellar arteries. Mild atherosclerotic narrowing of the upper left PCA branch. No treatable or flow limiting stenosis.  Symmetric carotid arteries. No cavernous sinus stenosis. Irregularity of the mid right cavernous sinus with small medial outpouching is likely atherosclerotic. Small anterior communicating artery is likely present. Symmetric an open M1 and A1 segments. No evidence of branch vessel occlusion. There are 2 outpouchings from the left supraclinoid carotid artery which are directed inferiorly. The more distal outpouching appears to have an emanating vessel on source images, the more proximal without definite vessel.  IMPRESSION: 1. No acute arterial finding or flow limiting stenosis. 2. Two 2 mm outpouchings from the left supraclinoid carotid artery could reflect infundibula or aneurysms.   Electronically Signed   By: Monte Fantasia M.D.   On: 08/10/2015 11:59   Mr [name] IR Contrast  08/09/2015   CLINICAL DATA:  Non-small-cell lung cancer staging.   Confusion.  EXAM: MRI HEAD WITHOUT AND WITH CONTRAST  TECHNIQUE: Multiplanar, multiecho pulse sequences of the brain and surrounding structures were obtained without and with intravenous contrast.  CONTRAST:  20m MULTIHANCE GADOBENATE DIMEGLUMINE 529 MG/ML IV SOLN  COMPARISON:  07/08/2014  FINDINGS: Calvarium and upper cervical spine: No focal marrow signal abnormality.  Orbits: No significant findings.  Sinuses and Mastoids: Mucosal thickening in the bilateral mastoid air cells.  Brain: There are punctate acute infarcts throughout the bilateral cerebral hemispheric white matter and on the cortex of the bilateral occipital, parietal, and posterior frontal lobes. The largest infarct is in the left occipital white matter measuring 13 mm. No hemorrhagic conversion is noted.  Stippled enhancement on the surface of the left occipital lobe over a roughly 25 mm linear area. There is also a 5 mm area of cortical enhancement in the left parietal lobe on series 12, image 43.  Relative effacement of left sulci at the vertex which is likely from infarct related swelling.  Generalized brain  atrophy with prominent mesial temporal volume loss. Chronic small vessel disease with confluent ischemic gliosis around the lateral ventricles.  Critical Value/emergent results were called by telephone at the time of interpretation on 08/09/2015 at 12:18 pm to Dr. Curt Bears , who verbally acknowledged these results. As requested, the patient is being delivered to the [facility] emergency room.  IMPRESSION: 1. 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 No hemorrhagic conversion. 2. Left occipital and parietal cortex enhancement is likely from subacute infarct. Will need follow-up infused brain MRI in 2 to 3 months to exclude metastatic disease in this patient with history of stage IV lung cancer.   Electronically Signed   By: Monte Fantasia M.D.   On: 08/09/2015 12:20    Ct Abdomen Pelvis W Contrast  07/19/2015   CLINICAL DATA:  Right lung mass, probable lung cancer. Staging. Left leg swelling. Pulmonary embolism.  EXAM: CT ABDOMEN AND PELVIS WITH CONTRAST  TECHNIQUE: Multidetector CT imaging of the abdomen and pelvis was performed using the standard protocol following bolus administration of intravenous contrast.  CONTRAST:  165m OMNIPAQUE IOHEXOL 300 MG/ML  SOLN  COMPARISON:  CT chest 07/18/2015  FINDINGS: Lower chest: Small right pleural effusion with compressive atelectasis right lower lobe. Left lung is clear. Small pericardial effusion, slightly improved from yesterday.  Hepatobiliary: Ill-defined hypodensity adjacent to the falciform ligament without mass-effect. This fills in on delayed imaging and most likely is an area of transient perfusion defect or hepatic steatosis. Heterogeneous liver diffusely without additional focal mass lesion, likely due to timing of injection. No definite liver metastasis. Gallbladder and bile ducts normal.  Pancreas: Negative  Spleen: Negative  Adrenals/Urinary Tract: Large parapelvic cyst on the left measuring 7.4 x 4.3 cm. Negative for hydronephrosis. Early excretion of contrast on the initial phase. No renal calculi. Urinary bladder normal.  Stomach/Bowel: Negative for bowel obstruction.  No bowel thickening.  Vascular/Lymphatic: Atherosclerotic calcification aorta and iliac vessels. Probable occlusion right femoral artery.  Thrombus in the left femoral vein and iliac vein compatible with DVT. Small pulmonary embolism noted on yesterday's CT of the chest.  Reproductive: Prostate enlargement with prostate calcifications.  Other: Small amount of free fluid in the pelvis.  Musculoskeletal: Negative for fracture. Negative for skeletal metastasis. Mild lumbar disc degeneration.  IMPRESSION: Heterogeneous liver but no definite liver metastasis. Imaging findings are likely due to timing of the scan versus hepatic steatosis  Pericardial  effusion which may be due to metastatic disease. This is slightly improved from yesterday. Small right pleural effusion  DVT left femoral and iliac vein.  Apparent occlusion right femoral artery.  Small amount of free fluid in the pelvis. No mass or adenopathy in the abdomen or pelvis.   Electronically Signed   By: CFranchot GalloM.D.   On: 07/19/2015 10:33   Nm Pet Image Initial (pi) Skull Base To Thigh  08/09/2015   CLINICAL DATA:  Initial treatment strategy for metastatic poorly differentiated adenocarcinoma on right supraclavicular node the 07/19/2015. Morphologic features favor GI or bronchogenic primary.  EXAM: NUCLEAR MEDICINE PET SKULL BASE TO THIGH  TECHNIQUE: 7.04 mCi F-18 FDG was injected intravenously. Full-ring PET imaging was performed from the skull base to thigh after the radiotracer. CT data was obtained and used for attenuation correction and anatomic localization.  FASTING BLOOD GLUCOSE:  Value: 96 mg/dl  COMPARISON:  Chest CT 07/18/2015.  Abdominal pelvic CT 07/19/2015.  FINDINGS: NECK  There are multiple hypermetabolic supraclavicular lymph nodes  bilaterally. The largest node on the left measures 2.3 x 3.4 cm on image 38 and has an SUV max of 19.6. On the right, SUV max is 12.1. No hypermetabolic upper cervical lymph nodes demonstrated.There are no lesions of the pharyngeal mucosal space.  CHEST  There are multiple enlarged and hypermetabolic mediastinal and right hilar lymph nodes. The dominant low right paratracheal node measuring 4.4 x 2.8 cm on image 62 has an SUV max of 19.0. The lobulated right upper lobe pulmonary nodule is hypermetabolic with an SUV max of 12.6. A medially superior to the right hilum, there is additional nodular area of hypermetabolic activity, likely nodal. There is probable mild postobstructive pneumonitis posteriorly in the right upper lobe. Biapical scarring, bibasilar atelectasis and small bilateral pleural effusions are present. There is no abnormal metabolic  activity within the pleural space. There is a small pericardial effusion.  ABDOMEN/PELVIS  There is nonspecific low level hypermetabolic activity within the adrenal glands (SUV max 4.2 on the right and 3.5 on the left). There is no focal adrenal nodule, and this is favored to be physiologic. There is no abnormal metabolic activity within liver, spleen or pancreas. There is no hypermetabolic nodal activity. Left renal cysts, diffuse atherosclerosis and ascites again noted.  SKELETON  There is no hypermetabolic activity to suggest osseous metastatic disease. Soft tissue activity posterior to the left femoral greater trochanter, likely inflammatory.  IMPRESSION: 1. Right upper lobe lung cancer with extensive hypermetabolic nodal disease to the right hilum, mediastinum and supraclavicular stations bilaterally. By PET, this is consistent with stage IIIB disease (T1a N3 M0). 2. Low-level adrenal activity bilaterally without focal nodularity, likely physiologic. 3. Similar pleural effusions and ascites. 4. Diffuse atherosclerosis.   Electronically Signed   By: Richardean Sale M.D.   On: 08/09/2015 11:12   US Biopsy  07/20/2015   ADDENDUM REPORT: 07/20/2015 17:07  ADDENDUM: EXAM:  US BIOPSY CORE LYMPH NODE   Electronically Signed   By: Jacqulynn Cadet M.D.   On: 07/20/2015 17:07   07/20/2015   CLINICAL DATA:  74 year old male with newly diagnosed widespread metastatic disease and right lung mass concerning for primary lung cancer. Ultrasound-guided core biopsy of right supraclavicular adenopathy for tissue diagnosis.  EXAM: ULTRASOUND BIOPSY CORE LIVER  Date: 07/19/2015  PROCEDURE: 1. Ultrasound-guided core biopsy right supraclavicular node Interventional Radiologist:  Criselda Peaches, MD  ANESTHESIA/SEDATION: 1 mg Versed said administered for anxiolysis  MEDICATIONS: None required  TECHNIQUE: Informed consent was obtained from the patient following explanation of the procedure, risks, benefits and alternatives. The  patient understands, agrees and consents for the procedure. All questions were addressed. A time out was performed.  The right supraclavicular fossa was interrogated with ultrasound. There are multiple enlarged hypoechoic lymph nodes. A suitable skin entry site was selected and marked. The region was sterilely prepped and draped in standard fashion using Betadine skin prep. Local anesthesia was attained by infiltration with 1% lidocaine. A small dermatotomy was made.  Under real-time sonographic guidance, multiple 18 gauge core biopsies were obtained using a [employer] [place] automated biopsy device. Biopsy specimens were placed in saline and delivered to pathology for further analysis.  Hemostasis was attained with gentle manual pressure. The patient tolerated the procedure well.  COMPLICATIONS: None  Estimated blood loss:  0  IMPRESSION: Technically successful ultrasound-guided core biopsy of a right supraclavicular node.  Signed,  Criselda Peaches, MD  [facility]  [facility]  Electronically Signed: By: Dellis Filbert.D.  On: 07/19/2015 14:34   Dg Abd 2 Views  07/13/2015   CLINICAL DATA:  Abdominal pain with unintentional weight loss  EXAM: ABDOMEN - 2 VIEW  COMPARISON:  None.  FINDINGS: Supine and upright images obtained. There is diffuse stool throughout the colon and rectum. There is no bowel dilatation or air-fluid level suggesting obstruction. No free air. Lung bases are clear. There are surgical clips in the right upper quadrant region. There are vascular calcifications in the pelvis.  IMPRESSION: Diffuse stool throughout colon and rectum. No bowel obstruction. No free air. Lung bases clear. Nipple shadows noted incidentally bilaterally.   Electronically Signed   By: Lowella Grip III M.D.   On: 07/13/2015 10:11    Microbiology: No results found for this or any previous visit (from the past 240 hour(s)).   Labs: Basic Metabolic Panel:  Recent  Labs Lab 08/09/15 1520  NA 136  K 3.9  CL 105  CO2 20*  GLUCOSE 86  BUN 13  CREATININE 0.69  CALCIUM 8.4*   Liver Function Tests:  Recent Labs Lab 08/09/15 1520  AST 25  ALT 23  ALKPHOS 89  BILITOT 0.7  PROT 7.4  ALBUMIN 1.6*   No results for input(s): LIPASE, AMYLASE in the last 168 hours. No results for input(s): AMMONIA in the last 168 hours. CBC:  Recent Labs Lab 08/09/15 1520 08/10/15 0816 08/11/15 0125  WBC 10.3 9.8 10.0  NEUTROABS 6.9  --   --   HGB 8.1* 7.8* 7.2*  HCT 25.1* 24.2* 21.8*  MCV 79.2 80.1 78.4  PLT 301 287 289   Cardiac Enzymes:  Recent Labs Lab 08/09/15 1520 08/09/15 2205 08/10/15 0325  TROPONINI 0.10* 0.20* 0.24*   BNP: BNP (last 3 results) No results for input(s): BNP in the last 8760 hours.  ProBNP (last 3 results) No results for input(s): PROBNP in the last 8760 hours.  CBG:  Recent Labs Lab 08/09/15 0914  GLUCAP 39       Signed:  Bradly Sangiovanni  [facility] 08/11/2015, 1:58 PM

## 2015-08-11 NOTE — Progress Notes (Signed)
Pt discharged to [facility]. Transported via [place] at [phone]. Report called to Antoinette at [facility]. All pt's personal items sent with transport. [facility] will notify family contact upon pt's arrival. Wendee Copp, RN

## 2015-08-11 NOTE — Progress Notes (Signed)
STROKE TEAM PROGRESS NOTE   HISTORY Jared Rogers is a 74 y.o. 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 Family reports no acute change in function or mental status. They report ongoing weight loss, generalized weakness and poor PO intake.   MRI brain imaging reviewed, shows numerous small acute infarcts in the bilateral cerebral hemisphere.   Date last known well: unclear Time last known well: unclear tPA Given: no, unclear LSW   SUBJECTIVE (INTERVAL HISTORY) No family members present.  Spoke to patient via interpreter available at the bedside. I explained to him that. Small bilateral infarcts likely related to his lung cancer. I did not recommend workup for source of embolism any further since patient already has an indication for long-term anticoagulation due to pulmonary embolism. I also explained to him that he may need radiation because of brain metastasis.   OBJECTIVE Temp:  [98 F (36.7 C)-99.7 F (37.6 C)] 99.5 F (37.5 C) (08/26 1336) Pulse Rate:  [79-87] 82 (08/26 1336) Cardiac Rhythm:  [-] Normal sinus rhythm (08/26 0700) Resp:  [18-20] 20 (08/26 1336) BP: (110-149)/(61-76) 133/66 mmHg (08/26 1336) SpO2:  [99 %-100 %] 100 % (08/26 1336)   Recent Labs Lab 08/09/15 0914  GLUCAP 96    Recent Labs Lab 08/09/15 1520  NA 136  K 3.9  CL 105  CO2 20*  GLUCOSE 86  BUN 13  CREATININE 0.69  CALCIUM 8.4*    Recent Labs Lab 08/09/15 1520  AST 25  ALT 23  ALKPHOS 89  BILITOT 0.7  PROT 7.4  ALBUMIN 1.6*    Recent Labs Lab 08/09/15 1520 08/10/15 0816 08/11/15 0125  WBC 10.3 9.8 10.0  NEUTROABS 6.9  --   --   HGB 8.1* 7.8* 7.2*  HCT 25.1* 24.2* 21.8*  MCV 79.2 80.1 78.4  PLT 301 287 289    Recent Labs Lab 08/09/15 1520 08/09/15 2205 08/10/15 0325  TROPONINI 0.10* 0.20* 0.24*   No results for  input(s): LABPROT, INR in the last 72 hours.  Recent Labs  08/09/15 1745  COLORURINE AMBER*  LABSPEC 1.030  PHURINE 6.0  GLUCOSEU NEGATIVE  HGBUR NEGATIVE  BILIRUBINUR SMALL*  KETONESUR 15*  PROTEINUR 30*  UROBILINOGEN 2.0*  NITRITE NEGATIVE  LEUKOCYTESUR NEGATIVE       Component Value Date/Time   CHOL 126 08/10/2015 0325   TRIG 40 08/10/2015 0325   HDL 25* 08/10/2015 0325   CHOLHDL 5.0 08/10/2015 0325   VLDL 8 08/10/2015 0325   LDLCALC 93 08/10/2015 0325   Lab Results  Component Value Date   HGBA1C 6.0* 08/10/2015   No results found for: LABOPIA, COCAINSCRNUR, LABBENZ, AMPHETMU, THCU, LABBARB  No results for input(s): ETH in the last 168 hours.   IMAGING  Mr Kizzie Fantasia Contrast 08/09/2015    1. 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 No hemorrhagic conversion.  2. Left occipital and parietal cortex enhancement is likely from subacute infarct.  Will need follow-up infused brain MRI in 2 to 3 months to exclude metastatic disease in this patient with history of stage IV lung cancer.        Nm Pet Image Initial (pi) Skull Base To Thigh 08/09/2015    1. Right upper lobe lung cancer with extensive hypermetabolic nodal disease to the right hilum, mediastinum and supraclavicular stations bilaterally.  By PET, this is consistent with stage IIIB disease (T1a N3 M0).  2. Low-level adrenal activity bilaterally without focal nodularity, likely physiologic.  3. Similar pleural effusions and ascites.  4. Diffuse atherosclerosis.        PHYSICAL EXAM Frail elderly Hispanic male currently not in distress. . Afebrile. Head is nontraumatic. Neck is supple without bruit.    Cardiac exam no murmur or gallop. Lungs are clear to auscultation. Distal pulses are well felt.  Neurological Exam :  Exam is limited due to patient's limited knowledge of English and is performed with interpreter at the bedside. He is awake  alert interactive. Follows simple commands. Diminished attention, registration and recall. Disoriented to time and place. No dysarthria or aphasia. Extraocular movements are full range without nystagmus. Blinks to threat bilaterally. Fundi could not be visualized. Vision acuity seems adequate. Face is symmetric without weakness. Tongue is midline. Motor system exam able to lift all 4 extremities against gravity without drift. No focal weakness. Deep tendon reflexes are 1+ symmetric. Plantars are downgoing. Sensation appears preserved bilaterally. Coordination and gait cannot be tested reliably.   ASSESSMENT/PLAN Mr. Adolphus Hanf is a 74 y.o. male with history of lung cancer, hypertension, ongoing tobacco use, previous stroke, hyperlipidemia, alcohol abuse, DVT, pulmonary embolus, and migraine headaches presenting with an MRI showing bilateral cerebral hemisphere infarcts. He did not receive IV t-PA due to unknown time of onset.   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  Resultant minimal deficits  MRI  Numerous small acute infarcts in the bilateral cerebral hemisphere compatible with central embolic disease  MRA  :1. No acute arterial finding or flow limiting stenosis.  2. Two 2 mm outpouchings from the left supraclinoid carotid artery  could reflect infundibula or aneurysms  Carotid Doppler Bilateral: 1-39% ICA stenosis. Vertebral artery flow is antegrade.   2D Echo Left ventricle: The cavity size was mildly dilated. Systolic function was mildly to moderately reduced. The estimated ejection fraction was in the range of 40% to 45%. Mild diffuse hypokinesis.  LDL 93  HgbA1c 6.0  Intravenous heparin for VTE prophylaxis Diet Heart Room service appropriate?: Yes; Fluid consistency:: Thin  Diet - low sodium heart healthy dementia,  Lovenox prior to admission, now on heparin  Patient counseled to be compliant with his antithrombotic  medications  Ongoing aggressive stroke risk factor management  Therapy recommendations: SNF Disposition:  SNF Hypertension  Home meds: Carvedilol and lisinopril  Stable   Hyperlipidemia  Home meds:  No lipid lowering medications prior to admission.  LDL 93, goal < 70  Add Lipitor 20 mg daily  Continue statin at discharge  Diabetes  HgbA1c 6, goal < 7.0  No hx of DM  Other Stroke Risk Factors  Advanced age  Cigarette smoker, advised to stop smoking  Lung cancer  Other Active Problems  Anemia  Elevated troponin enzymes  Other Pertinent History  History of DVT and pulmonary embolus  History of dementia  Hospital day # 2  PLAN Change to anticoagulation as per oncology 2 more appropriate treatment for pulmonary embolism and cancer related hypercoagulability.      I have personally examined this patient, reviewed notes, independently viewed imaging studies, participated in medical decision making and plan of care. I have made any additions or clarifications directly to the above note. Agree with note above.  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 I'm not sure how aggressive we need to be in terms of his stroke workup her anticoagulation given his poor prognosis. We will let oncology discussed this with the patient and family .Antony Contras, MD Medical Director [facility] Pager: [phone] 08/11/2015 2:04 PM    To contact Stroke Continuity provider, please refer to [email or web address] After hours, contact General Neurology

## 2015-08-11 NOTE — Progress Notes (Signed)
ANTICOAGULATION CONSULT NOTE - Follow Up Consult  Pharmacy Consult for Heparin Indication: new CVA, h/o PE  No Known Allergies  Patient Measurements: Height: '5\' 7"'$  (170.2 cm) Weight: 139 lb 1.8 oz (63.1 kg) IBW/kg (Calculated) : 66.1 Heparin Dosing Weight: 63 kg  Vital Signs: Temp: 99.2 F (37.3 C) (08/26 0949) Temp Source: Oral (08/26 0949) BP: 149/76 mmHg (08/26 0949) Pulse Rate: 87 (08/26 0949)  Labs:  Recent Labs  08/09/15 1520 08/09/15 2205 08/10/15 0325 08/10/15 0816 08/10/15 1610 08/11/15 0125  HGB 8.1*  --   --  7.8*  --  7.2*  HCT 25.1*  --   --  24.2*  --  21.8*  PLT 301  --   --  287  --  289  HEPARINUNFRC  --   --   --  <0.10* <0.10* <0.10*  CREATININE 0.69  --   --   --   --   --   TROPONINI 0.10* 0.20* 0.24*  --   --   --     Estimated Creatinine Clearance: 73.4 mL/min (by C-G formula based on Cr of 0.69).  Assessment: 74yo male on full-dose Lovenox (1.5 mg/kg/day) PTA for hx PE (last dose 8/23 at 0700), new dx lung cancer.  Admitted with new CVA, MRI negative for hemorrhagic conversion >> was switched to IV heparin for ease of reversal if hemorrhagic conversion occured.  Pharmacy now consulted to switch back to full-dose Lovenox, but BID dosing.  H&H has slowly been trending down, PLT wnl, no bleeding reported.   Plan:  -Discontinue heparin drip with first dose Lovenox -Start Lovenox '60mg'$  subcutaneously twice daily -Monitor CBC and s/sx of bleeding   Drucie Opitz, PharmD Clinical Pharmacist Pager: [phone] 08/11/2015 11:57 AM

## 2015-08-11 NOTE — Progress Notes (Signed)
Patient went into 5 beat Vtach during bath without symptoms. MD notified and will continue to monitor. Joaquin Bend E, RN 08/11/2015\ 2:28 AM

## 2015-08-11 NOTE — Care Management Note (Signed)
Case Management Note  Patient Details  Name: Jared Rogers MRN: [id number] Date of Birth: 1941-06-20  Subjective/Objective:                    Action/Plan:  Patient was active with [facility] for HHRN/PT/OT prior to admission.  Family is declining the recommended SNF and plans to take patient home and resume [place] services. Miranda with [facility] was notified. Dr Eliseo Squires aware of need for orders. Expected Discharge Date:                  Expected Discharge Plan:  [place]  In-House Referral:  Clinical Social Work  Discharge planning Services  CM Consult  Post Acute Care Choice:  Home Health Choice offered to:     DME Arranged:    DME Agency:     HH Arranged:  RN, PT, OT [place] Agency:  [facility]  Status of Service:     Medicare Important Message Given:    Date Medicare IM Given:    Medicare IM give by:    Date Additional Medicare IM Given:    Additional Medicare Important Message give by:     If discussed at [place] of Stay Meetings, dates discussed:    Additional Comments:  Rolm Baptise, RN 08/11/2015, 9:46 AM

## 2015-08-11 NOTE — Clinical Social Work Note (Signed)
Clinical Social Work Assessment  Patient Details  Name: Jared Rogers MRN: [id number] Date of Birth: [birth year]  Date of referral:  08/11/15               Reason for consult:  Facility Placement                Permission sought to share information with:  Family Supports Permission granted to share information::  Yes, Verbal Permission Granted  Name::     Jared Rogers  Relationship::  daughter-in-law  Housing/Transportation Living arrangements for the past 2 months:  [place] of Information:  Adult Children Patient Interpreter Needed:  Spanish Criminal Activity/Legal Involvement Pertinent to Current Situation/Hospitalization:  No - Comment as needed Significant Relationships:  Adult Children Lives with:  Adult Children Do you feel safe going back to the place where you live?  No Need for family participation in patient care:  Yes (Comment)  Care giving concerns:    Social Worker assessment / plan: Jared changed her mind about SNF placement. Jared reported that she wishes for the pt to go to [facility]. CSW contacted [facility] to confirmed a bed. Pt will transition to [facility].   Employment status:  Retired Programmer, applications  PT Recommendations:  [place] / Referral to community resources:  [place]  Patient/Family's Response to care: Jared reported that the care in which the pt received has been well.   Patient/Family's Understanding of and Emotional Response to Diagnosis, Current Treatment, and Prognosis: Jared acknowledged a change with the pt's health. Jared reported it is in the pt best interest to go to SNF.   Emotional Assessment Appearance:  Appears stated age Attitude/Demeanor/Rapport:  Unable to Assess Affect (typically observed):  Unable to Assess Orientation:  Oriented to Self Alcohol / Substance use:  Not Applicable Psych involvement (Current and /or in the community):  No  (Comment)  Discharge Needs  Concerns to be addressed:  Denies Needs/Concerns at this time Readmission within the last 30 days:  No Current discharge risk:  None Barriers to Discharge:  No Barriers Identified   Soleil Mas, LCSW 08/11/2015, 2:49 PM

## 2015-08-11 NOTE — Evaluation (Signed)
Speech Language Pathology Evaluation Patient Details Name: Jared Rogers MRN: [id number] DOB: 08/14/1941 Today's Date: 08/11/2015 Time:  -     Problem List:  Patient Active Problem List   Diagnosis Date Noted  . Elevated troponin I level   . CVA (cerebral infarction) 08/09/2015  . Stroke 08/09/2015  . Non-small cell carcinoma of lung, stage 4 07/28/2015  . Protein-calorie malnutrition, severe 07/19/2015  . Lymphadenopathy   . Tobacco abuse 07/18/2015  . PE (pulmonary embolism)   . Dementia with behavioral disturbance 06/13/2014  . Iron deficiency anemia 06/01/2014  . GERD (gastroesophageal reflux disease) 06/01/2014  . Leukocytosis, unspecified 06/01/2014  . LBBB (left bundle branch block) 03/30/2014  . PUD (peptic ulcer disease)   . Hypertension    Past Medical History:  Past Medical History  Diagnosis Date  . Depression   . Hx of migraines   . Alcohol abuse     In past  . GERD (gastroesophageal reflux disease)   . SOB (shortness of breath)     sometimes at night  . PUD (peptic ulcer disease)     with bleeding ulcers and bowel obstruction in 1994 and 1995  . DDD (degenerative disc disease), cervical   . DDD (degenerative disc disease), lumbosacral   . Hyperlipidemia   . Stroke     cerebellar CVA  . Shingles   . Asthmatic bronchitis   . Hypertension    Past Surgical History: History reviewed. No pertinent past surgical history. HPI:  Jared Rogers is a 74 y.o. male hx of PE (on Lovenox), dementia, recent diagnosis of lung cancer (Stage IV non-small cell) sent to the ED after routine MRI scan of the brain done as part of screening for stage IV lung cancer.  MRI brain showed multiple small infarcts suggestive of embolic disease. Family reports no acute change in function or mental status.   Assessment / Plan / Recommendation Clinical Impression  Pt demonstrates baseline cognitive deficits associated with memory and orientation. Articulation is WNL. Pt is  able to particpate in basic functional conversation but has some difficulty with higher level word finding. No family present to determine if this is consistent with baseline or a new finding. Regardless, it does not signficantly impact functional communication. Would not recommend SLP f/u at this time. SLP to sign off.     SLP Assessment  Patient does not need any further Speech Lanaguage Pathology Services    Follow Up Recommendations       Frequency and Duration        Pertinent Vitals/Pain Pain Assessment: No/denies pain   SLP Goals     SLP Evaluation Prior Functioning  Cognitive/Linguistic Baseline: Baseline deficits Baseline deficit details: dementia Type of Home: House  Lives With: Family Available Help at Discharge: Family Vocation: Retired   Associate Professor  Overall Cognitive Status: No family/caregiver present to determine baseline cognitive functioning Arousal/Alertness: Awake/alert Orientation Level: Oriented to person;Disoriented to place;Disoriented to time;Disoriented to situation Attention: Sustained;Selective Sustained Attention: Appears intact Selective Attention: Appears intact Memory: Impaired Memory Impairment: Storage deficit;Retrieval deficit Awareness: Appears intact Problem Solving: Appears intact Safety/Judgment: Impaired    Comprehension  Auditory Comprehension Overall Auditory Comprehension: Appears within functional limits for tasks assessed    Expression Verbal Expression Overall Verbal Expression: Impaired Initiation: No impairment Automatic Speech: Name;Counting;Social Response Level of Generative/Spontaneous Verbalization: Sentence;Conversation Repetition: No impairment Naming: Impairment Confrontation: Impaired Convergent: Not tested Divergent: Not tested Verbal Errors: Aware of errors Pragmatics: No impairment Effective Techniques: Open ended questions  Oral / Motor Oral Motor/Sensory Function Overall Oral Motor/Sensory Function:  Appears within functional limits for tasks assessed Motor Speech Overall Motor Speech: Appears within functional limits for tasks assessed   GO    Herbie Baltimore, MA CCC-SLP [phone]  Lynann Beaver 08/11/2015, 2:31 PM

## 2015-08-15 ENCOUNTER — Ambulatory Visit: Payer: PPO | Admitting: Internal Medicine

## 2015-08-16 ENCOUNTER — Ambulatory Visit: Payer: PPO | Admitting: Nutrition

## 2015-08-16 NOTE — Progress Notes (Signed)
74 year old male diagnosed with metastatic lung cancer.  He is a patient of Dr. Earlie Server.  Past medical history includes hypertension, anemia, GERD, PE, depression, alcohol, PVD, stroke, hyperlipidemia.  Medications include Prilosec and Lipitor.  Labs include hemoglobin A1c 6.0, and albumin 1.6.  Height: 5 feet 7 inches. Weight: 139 pounds August 24. Usual body weight: 150 pounds. BMI: 21.78  Patient was seen with family and Spanish interpreter. Patient currently lives at [facility] and is in the skilled nursing facility. Patient states his appetite is poor.  Family reports patient will not tell anyone if he is hungry or ask for a snack. Patient reports intermittent constipation. He appears to be eating 3 meals daily.  States food is good. Patient has tried oral nutrition supplements and really enjoys them.  However, is not currently consuming them at the skilled nursing facility.  Nutrition diagnosis: Unintended weight loss related to poor appetite and metastatic lung cancer as evidenced by 11 pound weight loss from usual body weight.  Intervention:  Recommended family member or friend eat with the patient whenever possible to encourage him to increase intake. Recommended patient consume oral nutrition supplements 3 times a day between meals and provided samples. Encouraged family to request RD consult at [facility] and ask for order to provide oral nutrition supplements between meals. Educated patient on foods high in protein and encouraged him to consume at mealtimes. Encouraged patient to increase activity as tolerated and with assistance as needed. Questions were answered.  Teach back method was used.  My contact information was provided.  Monitoring, evaluation, goals: Patient will increase calories and protein, and add oral nutrition supplements 3 times a day between meals to minimize further weight loss.  Next visit: I'm available to follow-up with patient as  needed.  **Disclaimer: This note was dictated with voice recognition software. Similar sounding words can inadvertently be transcribed and this note may contain transcription errors which may not have been corrected upon publication of note.**

## 2015-08-18 ENCOUNTER — Encounter: Payer: Self-pay | Admitting: Gastroenterology

## 2015-08-18 ENCOUNTER — Ambulatory Visit (INDEPENDENT_AMBULATORY_CARE_PROVIDER_SITE_OTHER): Payer: PPO | Admitting: Gastroenterology

## 2015-08-18 VITALS — BP 122/78 | HR 80

## 2015-08-18 DIAGNOSIS — C3491 Malignant neoplasm of unspecified part of right bronchus or lung: Secondary | ICD-10-CM

## 2015-08-18 NOTE — Progress Notes (Signed)
Reviewed. Agree. Oncology could get UGI series if they wished in this high risk low yield circumstance

## 2015-08-18 NOTE — Patient Instructions (Signed)
Follow up as needed

## 2015-08-18 NOTE — Progress Notes (Signed)
     08/18/2015 Jared Rogers [id number] [birth year]   History of Present Illness:  This is a 74 year old male who is known to Dr. Henrene Pastor for colonoscopy in 03/2014 at which time the study was normal.  Recent diagnosis of non-small cell carcinoma lung cancer, stage 4.  Initially biopsies could not differentiate between lung and UGI primary so he was sent here to consider EGD.  Since that time he's had PET scan that seems quite definitive for lung primary.  He is very high risk for procedures with history of PE, CVA, on Lovenox.  At this point I am not convinced that EGD is needed for diagnosis or that it would change treatment plan.  I discussed with Dr. Havery Moros who agrees.  I have sent communication of this to Dr. Julien Nordmann as well who hopefully will contact us again if EGD is necessary.  No procedure scheduled at this time.  Patient daughter-in-law in agreement with plan.  Of note, the patient's daughter-in-law was with him today and she does not speak Spanish, which is the patient's primary language.  No interpreter was present for the visit today.   Current Medications, Allergies, Past Medical History, Past Surgical History, Family History and Social History were reviewed in [employer] record.   Physical Exam: BP 122/78 mmHg  Pulse 80  Ht   Wt  General: Thin male in no acute distress Head: Normocephalic and atraumatic Eyes:  Sclerae anicteric, conjunctiva pink  Ears: Normal auditory acuity Lungs: Clear throughout to auscultation Heart: Regular rate and rhythm Abdomen: Soft, non-distended.  Normal bowel sounds.  Non-tender. Musculoskeletal: Symmetrical with no gross deformities  Extremities: No edema  Neurological: Alert oriented x 4, grossly non-focal Psychological:  Alert and cooperative. Normal mood and affect  Assessment and Recommendations: -Recent diagnosis of non-small cell carcinoma lung cancer, stage 4:  Initially biopsies could not differentiate  between lung and UGI primary so he was sent here to consider EGD.  Since that time he's had PET scan that seems quite definitive for lung primary.  He is very high risk for procedures with history of PE, CVA, on Lovenox.  At this point I am not convinced that EGD is needed for diagnosis or that it would change treatment plan.  I discussed with Dr. Havery Moros who agrees.  I have sent communication of this to Dr. Julien Nordmann as well who hopefully will contact us again if EGD is necessary.  No procedure scheduled at this time.  Patient daughter-in-law in agreement with plan.

## 2015-08-25 ENCOUNTER — Encounter (HOSPITAL_COMMUNITY): Payer: Self-pay | Admitting: *Deleted

## 2015-08-25 ENCOUNTER — Telehealth: Payer: Self-pay | Admitting: *Deleted

## 2015-08-25 ENCOUNTER — Emergency Department (HOSPITAL_COMMUNITY)
Admission: EM | Admit: 2015-08-25 | Discharge: 2015-08-25 | Disposition: A | Discharge status: Home / Self Care | Payer: PPO | Attending: Emergency Medicine | Admitting: Emergency Medicine

## 2015-08-25 DIAGNOSIS — Z87891 Personal history of nicotine dependence: Secondary | ICD-10-CM | POA: Insufficient documentation

## 2015-08-25 DIAGNOSIS — Z8719 Personal history of other diseases of the digestive system: Secondary | ICD-10-CM | POA: Insufficient documentation

## 2015-08-25 DIAGNOSIS — I10 Essential (primary) hypertension: Secondary | ICD-10-CM | POA: Diagnosis not present

## 2015-08-25 DIAGNOSIS — F0390 Unspecified dementia without behavioral disturbance: Secondary | ICD-10-CM | POA: Insufficient documentation

## 2015-08-25 DIAGNOSIS — Z8739 Personal history of other diseases of the musculoskeletal system and connective tissue: Secondary | ICD-10-CM | POA: Diagnosis not present

## 2015-08-25 DIAGNOSIS — Z8619 Personal history of other infectious and parasitic diseases: Secondary | ICD-10-CM | POA: Insufficient documentation

## 2015-08-25 DIAGNOSIS — R799 Abnormal finding of blood chemistry, unspecified: Secondary | ICD-10-CM | POA: Diagnosis present

## 2015-08-25 DIAGNOSIS — J45909 Unspecified asthma, uncomplicated: Secondary | ICD-10-CM | POA: Diagnosis not present

## 2015-08-25 DIAGNOSIS — E785 Hyperlipidemia, unspecified: Secondary | ICD-10-CM | POA: Diagnosis not present

## 2015-08-25 DIAGNOSIS — Z8659 Personal history of other mental and behavioral disorders: Secondary | ICD-10-CM | POA: Insufficient documentation

## 2015-08-25 DIAGNOSIS — Z8673 Personal history of transient ischemic attack (TIA), and cerebral infarction without residual deficits: Secondary | ICD-10-CM | POA: Insufficient documentation

## 2015-08-25 DIAGNOSIS — Z79899 Other long term (current) drug therapy: Secondary | ICD-10-CM | POA: Diagnosis not present

## 2015-08-25 DIAGNOSIS — Z85118 Personal history of other malignant neoplasm of bronchus and lung: Secondary | ICD-10-CM | POA: Insufficient documentation

## 2015-08-25 DIAGNOSIS — Z8711 Personal history of peptic ulcer disease: Secondary | ICD-10-CM | POA: Insufficient documentation

## 2015-08-25 DIAGNOSIS — D649 Anemia, unspecified: Secondary | ICD-10-CM | POA: Insufficient documentation

## 2015-08-25 LAB — CBC WITH DIFFERENTIAL/PLATELET
Basophils Absolute: 0 10*3/uL (ref 0.0–0.1)
Basophils Relative: 0 % (ref 0–1)
EOS ABS: 0.1 10*3/uL (ref 0.0–0.7)
EOS PCT: 1 % (ref 0–5)
HCT: 22.8 % — ABNORMAL LOW (ref 39.0–52.0)
Hemoglobin: 7.3 g/dL — ABNORMAL LOW (ref 13.0–17.0)
LYMPHS ABS: 1.9 10*3/uL (ref 0.7–4.0)
Lymphocytes Relative: 14 % (ref 12–46)
MCH: 26.2 pg (ref 26.0–34.0)
MCHC: 32 g/dL (ref 30.0–36.0)
MCV: 81.7 fL (ref 78.0–100.0)
MONO ABS: 2.1 10*3/uL — AB (ref 0.1–1.0)
MONOS PCT: 16 % — AB (ref 3–12)
Neutro Abs: 9.3 10*3/uL — ABNORMAL HIGH (ref 1.7–7.7)
Neutrophils Relative %: 69 % (ref 43–77)
PLATELETS: 298 10*3/uL (ref 150–400)
RBC: 2.79 MIL/uL — AB (ref 4.22–5.81)
RDW: 18.8 % — AB (ref 11.5–15.5)
WBC: 13.4 10*3/uL — AB (ref 4.0–10.5)

## 2015-08-25 LAB — BASIC METABOLIC PANEL
Anion gap: 7 (ref 5–15)
BUN: 18 mg/dL (ref 6–20)
CHLORIDE: 104 mmol/L (ref 101–111)
CO2: 24 mmol/L (ref 22–32)
CREATININE: 0.67 mg/dL (ref 0.61–1.24)
Calcium: 8 mg/dL — ABNORMAL LOW (ref 8.9–10.3)
GFR calc Af Amer: >60 mL/min (ref >60)
GFR calc non Af Amer: >60 mL/min (ref >60)
GLUCOSE: 149 mg/dL — AB (ref 65–99)
POTASSIUM: 3.4 mmol/L — AB (ref 3.5–5.1)
Sodium: 135 mmol/L (ref 135–145)

## 2015-08-25 LAB — TYPE AND SCREEN
ABO/RH(D): A POS
Antibody Screen: NEGATIVE

## 2015-08-25 MED ORDER — HYDROCODONE-ACETAMINOPHEN 5-325 MG PO TABS: 1.0000 | ORAL_TABLET | ORAL | Status: DC | PRN

## 2015-08-25 NOTE — Care Management Note (Addendum)
Case Management Note  Patient Details  Name: Jared Rogers MRN: [id number] Date of Birth: 04/16/1941  Subjective/Objective:  74 year old male with past medical history including stage IV non-small cell lung cancer, dementia, CVA, hypertension who presents with anemia increased weakness.               Action/Plan:  Discussed home health services, palliative care/hospice services with patient's sister in law Royal Beirne, patient's son and discussed with patient's daughter Lenard Lance patient's EXN [phone] who lives in [place].  Patient currently at [facility] SN[facility] after recent admission.   Expected Discharge Date:                  Expected Discharge Plan:  [place] (with palliative care referral)  In-House Referral:     Discharge planning Services  CM Consult  Post Acute Care Choice:  Durable Medical Equipment Choice offered to:  [facility] POA / Guardian  DME Arranged:  Hospital bed, Shower stool, Wheelchair manual DME Agency:     HH Arranged:  RN [place] Agency:  [facility]  Status of Service:  Completed, signed off  Medicare Important Message Given:    Date Medicare IM Given:    Medicare IM give by:    Date Additional Medicare IM Given:    Additional Medicare Important Message give by:     If discussed at [place] of Stay Meetings, dates discussed:    Additional Comments:  Patient is a resident at [facility].  Discussed home health services with patient's sister in law Dowell Hoon at bedside.  Shirah Roseman reports the patient has been active with [facility] in the past before patient was admitted to the hospital.  Kani Chauvin reports the plan is to have patient come live with her after patient is discharged from SNF on 09/15.  Shyheem Whitham reports she would like the patient to have hospice services at home.  EDCM assessed for dme needs.  Kraven Calk reports patient will need a hospital bed, wheelchair and shower seat.  Patient already has a walker and a 3 in 1 at home.  EDCM provided Aaren Atallah  with list of home hospice providers in [facility] of which [place] was chosen.  EDCM discussed possibility of having hospice/palliative services initiated in the facility.  Jeana Kersting is agreeable to this.  EDCM called [facility] and spoke to patient's RN Antoinette.  Per Lorenso Courier, hospice cannot see patient if patient is in a rehab bed, but palliative care can be initiated then patient can be transitioned to home hospice when patient is discharged.  Antoinette reports facility uses [facility]. Antionette requesting order for referral for palliative care services and then facility can initiate services.  Informed Claudia Alvizo of this, she is agreeable to this plan.  Genette Huertas also requesting somone to sit and stay with patient for a few hours while she is in work.  EDCM provided Taichi Repka with list of private duty nursing agency list and informed her it would be an out of pocket expense.  EDCM also provided Kaidance Pantoja with contact information for DSS for Medicaid to assist with the initiation of CAPS services.  Informed Arlinda Barcelona there may be a waiting list for CAPS services.  Maretta Overdorf verbalized understanding.  Patient is spanish speaking only with dementia.  EDCM called patient's POA Yoli and explained above.  Yoli very thankful for services and is agreeable.  [facility] discussed patient with EDP who placed order for referral for hospice/palliative care.  Orders for hospital bed, wheelchair and shower seat placed and faxed  to [facility] with confirmation of receipt.  Hospice/palliative referral place on patient's chart to go back to [facility].  Discussed with EDRN.  Patient's family thankful for services.  No further EDCM needs at this time.  Christen Bame Ezella Kell, RN 08/25/2015, 9:39 PM

## 2015-08-25 NOTE — ED Notes (Signed)
PTAR here to take pt back to [facility].

## 2015-08-25 NOTE — Discharge Instructions (Signed)
Anemia inespecfica (Anemia, Nonspecific) La anemia es una enfermedad en la que la concentracin de glbulos rojos o el nivel de hemoglobina en la sangre estn por debajo de lo normal. La hemoglobina es la sustancia de los glbulos rojos que lleva el oxgeno a todo el cuerpo. La anemia da como resultado que los tejidos no reciban la cantidad suficiente de oxgeno.  CAUSAS  Las causas ms frecuentes de anemia son:   Elvina Mattes. El sangrado puede ser interno o externo. Incluye sangrado excesivo debido al perodo (en las mujeres) o por los intestinos.   Dficit nutricional.   Enfermedad renal, tiroidea o heptica crnicas.  Enfermedades de la mdula sea que disminuyen la produccin de glbulos rojos.  Cncer y tratamientos para Science writer.  VIH, sida y sus tratamientos.  Trastornos del bazo que aumentan la destruccin de glbulos rojos.  Enfermedades de [employer].  Destruccin excesiva de glbulos rojos debido a una infeccin, a medicamentos y a Nurse, mental health. SIGNOS Y SNTOMAS   Debilidad leve.   Mareos.   Dolor de [place].  Palpitaciones.   Falta de aire, especialmente con el ejercicio.   Palidez.  Sensibilidad al fro.  Indigestin.  Nuseas.  Dificultad para dormir.  Dificultad para concentrarse. Los sntomas pueden ocurrir repentinamente o pueden Psychologist, forensic.  DIAGNSTICO  Con frecuencia es necesario realizar anlisis de [employer]. Estos ayudan al profesional a Adult nurse. Su mdico controlar la materia fecal para Hydrographic surveyor la presencia de [place] y buscar otras causas de prdida de [place].  TRATAMIENTO  El tratamiento vara segn la causa de la anemia. Las opciones de tratamiento son:   Suplementos de hierro, vitamina Z56, o cido flico.   Medicamentos con hormonas.   Transfusin de [place]. Ser necesaria en los casos de prdida de [place] grave.   Hospitalizacin. Ser necesaria si la  prdida de sangre es continua y significativa.   Cambios en la dieta.  Extirpacin del bazo. INSTRUCCIONES PARA EL CUIDADO EN EL HOGAR Cumpla con todas las visitas de control. Generalmente demora varias semanas corregir la anemia, y es muy importante que el mdico controle su enfermedad y su respuesta al [place]. SOLICITE ATENCIN MDICA DE INMEDIATO SI:   Siente debilidad extrema, falta de aire o dolor en el pecho.   Se siente mareado o tiene dificultad para concentrarse.  Tiene una hemorragia vaginal abundante.   Aparece una erupcin cutnea.   La materia fecal es negra, de aspecto alquitranado.   Se desmaya.   Vomita sangre.   Vomita repetidas veces.   Siente dolor abdominal.  Tiene fiebre o sntomas persistentes durante ms de 2 - 3 das.   Tiene fiebre y los sntomas empeoran repentinamente.   Se deshidrata.  ASEGRESE DE QUE:  Comprende estas instrucciones.  Controlar su afeccin.  Recibir ayuda de inmediato si no mejora o si empeora. Document Released: 12/02/2005 Document Revised: 08/04/2013 [facility] Patient Information 2015 [place]. This information is not intended to replace advice given to you by your health care provider. Make sure you discuss any questions you have with your health care provider.

## 2015-08-25 NOTE — ED Notes (Signed)
PTAR called to take pt back to [facility].

## 2015-08-25 NOTE — ED Notes (Signed)
Delay in lab dray, nurse starting IV

## 2015-08-25 NOTE — Progress Notes (Signed)
Patient suffers from pain and weakness secondary to metastatic cancer which impairs their ability to perform daily activities like ADLs, ambulation in the home. A walker/cane will not resolve  issue with performing activities of daily living. A wheelchair will allow patient to safely perform daily activities. Patient is not able to propel themselves in the home using a standard weight wheelchair due to weakness. Patient can self propel in the lightweight wheelchair.  Accessories: elevating leg rests (ELRs), wheel locks, extensions and anti-tippers.

## 2015-08-25 NOTE — ED Notes (Signed)
Report to Lorenso Courier, Therapist, sports at [facility].

## 2015-08-25 NOTE — Telephone Encounter (Signed)
TC from [facility] stating that pt had CBC done today. HGB is 6.3 Facility seeking recommendation. Spoke with Dr. Julien Nordmann. He recommends that facility transport pt to ED to be admitted for transfusion.  TC back to [facility] and spoke with pt's nurse and relayed that information to her.  She will facilitate transport to local ED.

## 2015-08-25 NOTE — ED Notes (Signed)
Attempted IV start x 2. Charge RN, [employer] aware.

## 2015-08-25 NOTE — ED Notes (Signed)
Attempted to draw labs, had the blood flowing and pt attempted to hit. Pt pulled needle out of hand.

## 2015-08-25 NOTE — ED Notes (Addendum)
Patient sent out from [facility] facility for low hgb. Patient has history of lung carcinoma, dementia. Patient is not [place] speaking. He speaks Spanish. hbg 6.3 from facility.

## 2015-08-25 NOTE — ED Notes (Signed)
MD at bedside. Dr. Rex Kras at bedside.

## 2015-08-25 NOTE — ED Provider Notes (Signed)
CSN: [id number]     Arrival date & time 08/25/15  1640 History   First MD Initiated Contact with Patient 08/25/15 1646     Chief Complaint  Patient presents with  . Abnormal Lab     (Consider location/radiation/quality/duration/timing/severity/associated sxs/prior Treatment) HPI Comments: 74 year old male with past medical history including stage IV non-small cell lung cancer, dementia, CVA, hypertension who presents with anemia. Patient is Spanish-speaking and history limited due to his dementia as well as language barrier. History obtained with the assistance of the patient's son and daughter-in-law. The patient was sent from his rehabilitation facility for low hemoglobin. He has a history of anemia requiring blood transfusions, last approximately 6 weeks ago. They report that he has not had any recent illness and they are unaware of any recent fevers or vomiting. The patient currently denies any complaints of chest pain, abdominal pain, or headache.  The history is provided by a relative. The history is limited by a language barrier.    Past Medical History  Diagnosis Date  . Depression   . Hx of migraines   . Alcohol abuse     In past  . GERD (gastroesophageal reflux disease)   . SOB (shortness of breath)     sometimes at night  . PUD (peptic ulcer disease)     with bleeding ulcers and bowel obstruction in 1994 and 1995  . DDD (degenerative disc disease), cervical   . DDD (degenerative disc disease), lumbosacral   . Hyperlipidemia   . Stroke     cerebellar CVA  . Shingles   . Asthmatic bronchitis   . Hypertension   . Lung cancer   . Anemia    Past Surgical History  Procedure Laterality Date  . Lung biopsy    . Lymph node biopsy     Family History  Problem Relation Age of Onset  . Heart disease Father   . Heart failure Father   . Lung cancer Sister    Social History  Substance Use Topics  . Smoking status: Former Smoker -- 0.25 packs/day    Types: Cigarettes   Quit date: 07/17/2015  . Smokeless tobacco: Never Used     Comment: Pt smokes 5-6 cigarettes a day  . Alcohol Use: No    Review of Systems  Unable to perform ROS: Dementia     Allergies  Review of patient's allergies indicates no known allergies.  Home Medications   Prior to Admission medications   Medication Sig Start Date End Date Taking? Authorizing Provider  atorvastatin (LIPITOR) 20 MG tablet Take 1 tablet (20 mg total) by mouth daily at 6 PM. 08/11/15  Yes Geradine Girt, DO  carvedilol (COREG) 6.25 MG tablet Take 1 tablet (6.25 mg total) by mouth 2 (two) times daily with a meal. 07/21/15  Yes Kelvin Cellar, MD  enoxaparin (LOVENOX) 60 MG/0.6ML injection Inject 0.6 mLs (60 mg total) into the skin 2 (two) times daily. 08/11/15  Yes Geradine Girt, DO  feeding supplement (BOOST / RESOURCE BREEZE) LIQD Take 1 Container by mouth 2 (two) times daily between meals. 07/21/15   Kelvin Cellar, MD  HYDROcodone-acetaminophen (NORCO/VICODIN) 5-325 MG per tablet Take 1-2 tablets by mouth every 4 (four) hours as needed for severe pain. 08/25/15   Wenda Overland Zeynep Fantroy, MD   BP 133/60 mmHg  Pulse 87  Resp 16  SpO2 95% Physical Exam  Constitutional: No distress.  Cachectic, chronically ill-appearing  HENT:  Head: Normocephalic and atraumatic.  Moist mucous membranes  Eyes: Conjunctivae are normal. Pupils are equal, round, and reactive to light.  Neck: Neck supple.  Cardiovascular: Normal rate, regular rhythm and normal heart sounds.   No murmur heard. Pulmonary/Chest: Breath sounds normal.  Very mildly increased WOB, diminished BS R lower lung  Abdominal: Soft. Bowel sounds are normal. He exhibits no distension. There is no tenderness.  Musculoskeletal:  1+ b/l LE edema  Neurological: He is alert.  Fluent speech  Skin: Skin is warm and dry.  Psychiatric: He has a normal mood and affect. Judgment normal.  Nursing note and vitals reviewed.   ED Course  Procedures (including critical  care time) Labs Review Labs Reviewed  CBC WITH DIFFERENTIAL/PLATELET - Abnormal; Notable for the following:    WBC 13.4 (*)    RBC 2.79 (*)    Hemoglobin 7.3 (*)    HCT 22.8 (*)    RDW 18.8 (*)    Neutro Abs 9.3 (*)    Monocytes Relative 16 (*)    Monocytes Absolute 2.1 (*)    All other components within normal limits  BASIC METABOLIC PANEL - Abnormal; Notable for the following:    Potassium 3.4 (*)    Glucose, Bld 149 (*)    Calcium 8.0 (*)    All other components within normal limits  TYPE AND SCREEN    Imaging Review No results found. I have personally reviewed and evaluated these lab results as part of my medical decision-making.   EKG Interpretation None      MDM   Final diagnoses:  Anemia, unspecified anemia type   metastatic non-small cell lung cancer Dementia   74 year old male with metastatic lung cancer who presents for low hemoglobin found on the labs from his rehabilitation facility. Patient awake, alert, and in no acute distress at presentation. Vital signs unremarkable. Obtained above lab work including type and screen.  Labwork showed hemoglobin 7.3, hematocrit 22.8. These labs are stable from recent lab work. The patient has no tachycardia or hypotension to suggest acute blood loss and I feel that he is stable and does not require a blood transfusion at this time given risk of allergic reaction or acquiring antibody. I have discussed results with the family and they are in agreement with plan. Family requested referral to hospice. I consulted the case manager who spoke with the family and has made arrangements with family. All questions answered. Patient discharged in satisfactory condition.    Sharlett Iles, MD 08/25/15 [phone]

## 2015-08-28 ENCOUNTER — Telehealth: Payer: Self-pay | Admitting: Medical Oncology

## 2015-08-28 ENCOUNTER — Encounter: Payer: Self-pay | Admitting: Medical Oncology

## 2015-08-28 NOTE — Telephone Encounter (Addendum)
Per Amy pts bones are hurting -every time he moves around in bed his face would squinch up and moan. He is having trouble swallowing. He is at [facility] and daughter needs letter re prognosis and she needs to come up to [facility]. I called [facility] and pt is taking norco 1-2 tablets for pain . A palliative care meeting at [facility] is also being scheduled to address pts care needs.

## 2015-08-31 NOTE — Progress Notes (Signed)
EDCM called and spoke to patient's sister in law Dandria Griego regarding Palliative care consult and equipment from [facility].  Mandolin Falwell reports Palliative care meeting will be at [facility] on Tuesday.  Amalea Ottey also reports [facility] has contacted her about the equipment but she has, "put a hold on it."  Deniro Laymon reports patient may need to stay in facility rather than coming home to live with her.  EDCM encouraged Larose Batres to speak to social worker at facility.  Demontay Grantham reports she has been speaking to the Education officer, museum.  Orval Dortch thankful for follow up phone call.  No further EDCM needs at this time.

## 2015-09-03 ENCOUNTER — Observation Stay (HOSPITAL_COMMUNITY)
Admission: EM | Admit: 2015-09-03 | Discharge: 2015-09-06 | Disposition: A | Discharge status: Hospice | Payer: PPO | Attending: Internal Medicine | Admitting: Internal Medicine

## 2015-09-03 ENCOUNTER — Encounter (HOSPITAL_COMMUNITY): Payer: Self-pay | Admitting: Emergency Medicine

## 2015-09-03 DIAGNOSIS — Z87891 Personal history of nicotine dependence: Secondary | ICD-10-CM | POA: Insufficient documentation

## 2015-09-03 DIAGNOSIS — Z9981 Dependence on supplemental oxygen: Secondary | ICD-10-CM | POA: Diagnosis not present

## 2015-09-03 DIAGNOSIS — Z8711 Personal history of peptic ulcer disease: Secondary | ICD-10-CM | POA: Insufficient documentation

## 2015-09-03 DIAGNOSIS — M5030 Other cervical disc degeneration, unspecified cervical region: Secondary | ICD-10-CM | POA: Insufficient documentation

## 2015-09-03 DIAGNOSIS — F0390 Unspecified dementia without behavioral disturbance: Secondary | ICD-10-CM | POA: Diagnosis not present

## 2015-09-03 DIAGNOSIS — D649 Anemia, unspecified: Secondary | ICD-10-CM | POA: Insufficient documentation

## 2015-09-03 DIAGNOSIS — K219 Gastro-esophageal reflux disease without esophagitis: Secondary | ICD-10-CM | POA: Insufficient documentation

## 2015-09-03 DIAGNOSIS — C3490 Malignant neoplasm of unspecified part of unspecified bronchus or lung: Secondary | ICD-10-CM | POA: Diagnosis not present

## 2015-09-03 DIAGNOSIS — D509 Iron deficiency anemia, unspecified: Secondary | ICD-10-CM | POA: Diagnosis not present

## 2015-09-03 DIAGNOSIS — I10 Essential (primary) hypertension: Secondary | ICD-10-CM | POA: Insufficient documentation

## 2015-09-03 DIAGNOSIS — R627 Adult failure to thrive: Secondary | ICD-10-CM | POA: Diagnosis not present

## 2015-09-03 DIAGNOSIS — Z801 Family history of malignant neoplasm of trachea, bronchus and lung: Secondary | ICD-10-CM | POA: Diagnosis not present

## 2015-09-03 DIAGNOSIS — Z8673 Personal history of transient ischemic attack (TIA), and cerebral infarction without residual deficits: Secondary | ICD-10-CM | POA: Insufficient documentation

## 2015-09-03 DIAGNOSIS — J189 Pneumonia, unspecified organism: Secondary | ICD-10-CM

## 2015-09-03 DIAGNOSIS — G9340 Encephalopathy, unspecified: Secondary | ICD-10-CM | POA: Insufficient documentation

## 2015-09-03 DIAGNOSIS — K279 Peptic ulcer, site unspecified, unspecified as acute or chronic, without hemorrhage or perforation: Secondary | ICD-10-CM | POA: Diagnosis present

## 2015-09-03 DIAGNOSIS — Z79899 Other long term (current) drug therapy: Secondary | ICD-10-CM | POA: Diagnosis not present

## 2015-09-03 DIAGNOSIS — R52 Pain, unspecified: Secondary | ICD-10-CM | POA: Diagnosis not present

## 2015-09-03 DIAGNOSIS — M5137 Other intervertebral disc degeneration, lumbosacral region: Secondary | ICD-10-CM | POA: Insufficient documentation

## 2015-09-03 DIAGNOSIS — R451 Restlessness and agitation: Secondary | ICD-10-CM

## 2015-09-03 DIAGNOSIS — Z66 Do not resuscitate: Secondary | ICD-10-CM

## 2015-09-03 DIAGNOSIS — I2699 Other pulmonary embolism without acute cor pulmonale: Secondary | ICD-10-CM | POA: Diagnosis present

## 2015-09-03 DIAGNOSIS — Z86711 Personal history of pulmonary embolism: Secondary | ICD-10-CM | POA: Diagnosis not present

## 2015-09-03 DIAGNOSIS — Z515 Encounter for palliative care: Secondary | ICD-10-CM

## 2015-09-03 DIAGNOSIS — R600 Localized edema: Secondary | ICD-10-CM | POA: Insufficient documentation

## 2015-09-03 DIAGNOSIS — E785 Hyperlipidemia, unspecified: Secondary | ICD-10-CM | POA: Diagnosis not present

## 2015-09-03 NOTE — ED Notes (Signed)
Bed: OQ94 Expected date:  Expected time:  Means of arrival:  Comments: EMS 74 yo male/end stage metastatic lung cancer/needs palliative care

## 2015-09-03 NOTE — ED Notes (Signed)
Per EMS, family wished transport for evaluation in relation to pt's existing dx of end stage metastatic lung cancer and related failure to thrive.

## 2015-09-04 ENCOUNTER — Emergency Department (HOSPITAL_COMMUNITY): Payer: PPO

## 2015-09-04 ENCOUNTER — Encounter (HOSPITAL_COMMUNITY): Payer: Self-pay | Admitting: Internal Medicine

## 2015-09-04 DIAGNOSIS — R627 Adult failure to thrive: Secondary | ICD-10-CM | POA: Diagnosis not present

## 2015-09-04 DIAGNOSIS — Z515 Encounter for palliative care: Secondary | ICD-10-CM

## 2015-09-04 DIAGNOSIS — R451 Restlessness and agitation: Secondary | ICD-10-CM | POA: Diagnosis not present

## 2015-09-04 DIAGNOSIS — C3490 Malignant neoplasm of unspecified part of unspecified bronchus or lung: Secondary | ICD-10-CM

## 2015-09-04 DIAGNOSIS — Z66 Do not resuscitate: Secondary | ICD-10-CM

## 2015-09-04 DIAGNOSIS — D649 Anemia, unspecified: Secondary | ICD-10-CM | POA: Insufficient documentation

## 2015-09-04 DIAGNOSIS — J189 Pneumonia, unspecified organism: Secondary | ICD-10-CM | POA: Insufficient documentation

## 2015-09-04 LAB — URINALYSIS, ROUTINE W REFLEX MICROSCOPIC
GLUCOSE, UA: NEGATIVE mg/dL
Hgb urine dipstick: NEGATIVE
Ketones, ur: NEGATIVE mg/dL
Leukocytes, UA: NEGATIVE
NITRITE: NEGATIVE
PH: 5.5 (ref 5.0–8.0)
Protein, ur: NEGATIVE mg/dL
SPECIFIC GRAVITY, URINE: 1.023 (ref 1.005–1.030)
Urobilinogen, UA: 2 mg/dL — ABNORMAL HIGH (ref 0.0–1.0)

## 2015-09-04 LAB — COMPREHENSIVE METABOLIC PANEL
ALBUMIN: 1.4 g/dL — AB (ref 3.5–5.0)
ALK PHOS: 99 U/L (ref 38–126)
ALT: 16 U/L — AB (ref 17–63)
ANION GAP: 9 (ref 5–15)
AST: 22 U/L (ref 15–41)
BILIRUBIN TOTAL: 1.2 mg/dL (ref 0.3–1.2)
BUN: 21 mg/dL — AB (ref 6–20)
CO2: 24 mmol/L (ref 22–32)
CREATININE: 0.81 mg/dL (ref 0.61–1.24)
Calcium: 8.1 mg/dL — ABNORMAL LOW (ref 8.9–10.3)
Chloride: 106 mmol/L (ref 101–111)
GFR calc Af Amer: >60 mL/min (ref >60)
GFR calc non Af Amer: >60 mL/min (ref >60)
GLUCOSE: 96 mg/dL (ref 65–99)
Potassium: 3.8 mmol/L (ref 3.5–5.1)
SODIUM: 139 mmol/L (ref 135–145)
Total Protein: 7.4 g/dL (ref 6.5–8.1)

## 2015-09-04 LAB — CBC WITH DIFFERENTIAL/PLATELET
BASOS ABS: 0 10*3/uL (ref 0.0–0.1)
Basophils Relative: 0 %
EOS ABS: 0 10*3/uL (ref 0.0–0.7)
Eosinophils Relative: 0 %
HEMATOCRIT: 20.4 % — AB (ref 39.0–52.0)
HEMOGLOBIN: 6.5 g/dL — AB (ref 13.0–17.0)
LYMPHS PCT: 11 %
Lymphs Abs: 1.9 10*3/uL (ref 0.7–4.0)
MCH: 26.3 pg (ref 26.0–34.0)
MCHC: 31.9 g/dL (ref 30.0–36.0)
MCV: 82.6 fL (ref 78.0–100.0)
MONOS PCT: 10 %
Monocytes Absolute: 1.8 10*3/uL — ABNORMAL HIGH (ref 0.1–1.0)
NEUTROS PCT: 79 %
Neutro Abs: 13.8 10*3/uL — ABNORMAL HIGH (ref 1.7–7.7)
Platelets: 195 10*3/uL (ref 150–400)
RBC: 2.47 MIL/uL — AB (ref 4.22–5.81)
RDW: 20.2 % — ABNORMAL HIGH (ref 11.5–15.5)
WBC: 17.5 10*3/uL — ABNORMAL HIGH (ref 4.0–10.5)

## 2015-09-04 LAB — TYPE AND SCREEN
ABO/RH(D): A POS
Antibody Screen: NEGATIVE

## 2015-09-04 LAB — BRAIN NATRIURETIC PEPTIDE: B Natriuretic Peptide: 646.1 pg/mL — ABNORMAL HIGH (ref 0.0–100.0)

## 2015-09-04 MED ORDER — LIDOCAINE HCL 2 % EX GEL
CUTANEOUS | Status: AC
  Filled 2015-09-04: qty 10

## 2015-09-04 MED ORDER — VANCOMYCIN HCL IN DEXTROSE 750-5 MG/150ML-% IV SOLN: 750.0000 mg | Freq: Two times a day (BID) | INTRAVENOUS | Status: DC

## 2015-09-04 MED ORDER — AZITHROMYCIN 500 MG IV SOLR
500.0000 mg | Freq: Once | INTRAVENOUS | Status: AC
  Administered 2015-09-04: 500 mg via INTRAVENOUS
  Filled 2015-09-04: qty 500

## 2015-09-04 MED ORDER — SODIUM CHLORIDE 0.9 % IV SOLN
INTRAVENOUS | Status: DC
  Administered 2015-09-04: 1000 mL via INTRAVENOUS

## 2015-09-04 MED ORDER — PIPERACILLIN-TAZOBACTAM 3.375 G IVPB 30 MIN
3.3750 g | Freq: Once | INTRAVENOUS | Status: AC
  Administered 2015-09-04: 3.375 g via INTRAVENOUS
  Filled 2015-09-04: qty 50

## 2015-09-04 MED ORDER — ONDANSETRON HCL 4 MG PO TABS: 4.0000 mg | ORAL_TABLET | Freq: Four times a day (QID) | ORAL | Status: DC | PRN

## 2015-09-04 MED ORDER — ONDANSETRON HCL 4 MG/2ML IJ SOLN: 4.0000 mg | Freq: Four times a day (QID) | INTRAMUSCULAR | Status: DC | PRN

## 2015-09-04 MED ORDER — MORPHINE SULFATE (CONCENTRATE) 10 MG/0.5ML PO SOLN
5.0000 mg | ORAL | Status: DC | PRN
  Administered 2015-09-04: 5 mg via ORAL
  Filled 2015-09-04: qty 0.5

## 2015-09-04 MED ORDER — PIPERACILLIN-TAZOBACTAM 4.5 G IVPB: 4.5000 g | Freq: Once | INTRAVENOUS | Status: DC

## 2015-09-04 MED ORDER — MORPHINE SULFATE (CONCENTRATE) 10 MG/0.5ML PO SOLN: 5.0000 mg | ORAL | Status: DC | PRN

## 2015-09-04 MED ORDER — ATORVASTATIN CALCIUM 20 MG PO TABS
20.0000 mg | ORAL_TABLET | Freq: Every day | ORAL | Status: DC
  Filled 2015-09-04: qty 1

## 2015-09-04 MED ORDER — BOOST / RESOURCE BREEZE PO LIQD: 1.0000 | Freq: Two times a day (BID) | ORAL | Status: DC

## 2015-09-04 MED ORDER — ACETAMINOPHEN 650 MG RE SUPP: 650.0000 mg | Freq: Four times a day (QID) | RECTAL | Status: DC | PRN

## 2015-09-04 MED ORDER — MORPHINE SULFATE (PF) 2 MG/ML IV SOLN
1.0000 mg | INTRAVENOUS | Status: DC
  Administered 2015-09-04 – 2015-09-05 (×5): 1 mg via INTRAVENOUS
  Filled 2015-09-04 (×5): qty 1

## 2015-09-04 MED ORDER — CETYLPYRIDINIUM CHLORIDE 0.05 % MT LIQD
7.0000 mL | Freq: Two times a day (BID) | OROMUCOSAL | Status: DC
  Administered 2015-09-04: 7 mL via OROMUCOSAL

## 2015-09-04 MED ORDER — PANTOPRAZOLE SODIUM 40 MG PO TBEC
40.0000 mg | DELAYED_RELEASE_TABLET | Freq: Every day | ORAL | Status: DC
  Filled 2015-09-04: qty 1

## 2015-09-04 MED ORDER — ACETAMINOPHEN 325 MG PO TABS: 650.0000 mg | ORAL_TABLET | Freq: Four times a day (QID) | ORAL | Status: DC | PRN

## 2015-09-04 MED ORDER — VANCOMYCIN HCL IN DEXTROSE 1-5 GM/200ML-% IV SOLN
1000.0000 mg | Freq: Once | INTRAVENOUS | Status: DC
  Filled 2015-09-04 (×2): qty 200

## 2015-09-04 MED ORDER — ENOXAPARIN SODIUM 60 MG/0.6ML ~~LOC~~ SOLN
60.0000 mg | Freq: Two times a day (BID) | SUBCUTANEOUS | Status: DC
  Administered 2015-09-04: 60 mg via SUBCUTANEOUS
  Filled 2015-09-04 (×3): qty 0.6

## 2015-09-04 MED ORDER — CARVEDILOL 6.25 MG PO TABS
6.2500 mg | ORAL_TABLET | Freq: Two times a day (BID) | ORAL | Status: DC
  Filled 2015-09-04 (×3): qty 1

## 2015-09-04 NOTE — Progress Notes (Signed)
Nutrition Brief Note  Patient identified on the Malnutrition Screening Tool (MST) Report  Wt Readings from Last 15 Encounters:  09/04/15 138 lb 14.2 oz (63 kg)  08/09/15 139 lb 1.8 oz (63.1 kg)  08/08/15 139 lb (63.05 kg)  08/03/15 141 lb 8 oz (64.184 kg)  07/28/15 138 lb (62.596 kg)  07/21/15 133 lb 9.6 oz (60.601 kg)  07/13/15 131 lb (59.421 kg)  08/02/14 147 lb 12.8 oz (67.042 kg)  06/22/14 147 lb (66.679 kg)  06/01/14 146 lb (66.225 kg)  05/02/14 147 lb (66.679 kg)  04/19/14 146 lb (66.225 kg)  04/18/14 153 lb (69.4 kg)  04/12/14 150 lb (68.04 kg)  03/30/14 150 lb (68.04 kg)    Body mass index is 21.75 kg/(m^2). Patient meets criteria for normal weight based on current BMI.   Pt seen for MST. Weight stable with slight fluctuations x2 months. Pt with hx of stage 4 non-small cell lung carcinoma. Per H&P this AM: patient's daughter was at the bedside plan is to keep patient comfortable and no aggressive measures including no further labs but okay to continue with present medication. Palliative consult in place.  Current diet order is Regular, with no documented intakes. Labs and medications reviewed.   No nutrition interventions warranted at this time. If nutrition issues arise, please consult RD.      Jarome Matin, RD, LDN Inpatient Clinical Dietitian Pager # [phone] After hours/weekend pager # [phone]

## 2015-09-04 NOTE — Consult Note (Signed)
[place] responded to Palliative Care Spiritual Consult.  RN indicated that consult should be cancelled and that [facility] will be paged when family arrives. 4:08 PM Gwynn Burly

## 2015-09-04 NOTE — ED Provider Notes (Signed)
CSN: [id number]   Arrival date & time 09/03/15 2328  History  This chart was scribed for Shanon Rosser, MD by Altamease Oiler, ED Scribe. This patient was seen in room WA15/WA15 and the patient's care was started at 12:36 AM.  Chief Complaint  Patient presents with  . Failure To Thrive    HPI The history is provided by a relative. No language interpreter was used.    Level V caveat due to dementia and ALOC  Brought in by EMS, Jared Rogers is a 74 y.o. male with PMHx of dementia, stage IV non-small cell lung cancer, CVA, and hypertension who presents to the Emergency Department for evaluation of failure to thrive. Per family, the pt was at [facility] and his health has been declining over the last 3 weeks. Over the last 24 hours he has not been eating and appears to be in a lot of pain. Pt has had a productive cough and was started on oxygen yesterday. He has been less alert, appearing weak, and felt warm to the touch. Pt has a painful and swollen right arm due to a blood clot. His family is mainly concerned about his nourishment and level of comfort because he has not been receiving pain medication.   Past Medical History  Diagnosis Date  . Depression   . Hx of migraines   . Alcohol abuse     In past  . GERD (gastroesophageal reflux disease)   . SOB (shortness of breath)     sometimes at night  . PUD (peptic ulcer disease)     with bleeding ulcers and bowel obstruction in 1994 and 1995  . DDD (degenerative disc disease), cervical   . DDD (degenerative disc disease), lumbosacral   . Hyperlipidemia   . Stroke     cerebellar CVA  . Shingles   . Asthmatic bronchitis   . Hypertension   . Lung cancer   . Anemia     Past Surgical History  Procedure Laterality Date  . Lung biopsy    . Lymph node biopsy      Family History  Problem Relation Age of Onset  . Heart disease Father   . Heart failure Father   . Lung cancer Sister     Social History  Substance Use  Topics  . Smoking status: Former Smoker -- 0.25 packs/day    Types: Cigarettes    Quit date: 07/17/2015  . Smokeless tobacco: Never Used     Comment: Pt smokes 5-6 cigarettes a day  . Alcohol Use: No     Review of Systems  Unable to perform ROS  Home Medications   Prior to Admission medications   Medication Sig Start Date End Date Taking? Authorizing Provider  atorvastatin (LIPITOR) 20 MG tablet Take 1 tablet (20 mg total) by mouth daily at 6 PM. 08/11/15  Yes Geradine Girt, DO  carvedilol (COREG) 6.25 MG tablet Take 1 tablet (6.25 mg total) by mouth 2 (two) times daily with a meal. 07/21/15  Yes Kelvin Cellar, MD  enoxaparin (LOVENOX) 60 MG/0.6ML injection Inject 0.6 mLs (60 mg total) into the skin 2 (two) times daily. 08/11/15  Yes Geradine Girt, DO  feeding supplement (BOOST / RESOURCE BREEZE) LIQD Take 1 Container by mouth 2 (two) times daily between meals. 07/21/15  Yes Kelvin Cellar, MD  HYDROcodone-acetaminophen (NORCO/VICODIN) 5-325 MG per tablet Take 1-2 tablets by mouth every 4 (four) hours as needed for severe pain. 08/25/15  Yes Wenda Overland  Little, MD  Multiple Vitamin (MULTIVITAMIN WITH MINERALS) TABS tablet Take 1 tablet by mouth daily.   Yes Historical Provider, MD  omeprazole (PRILOSEC) 20 MG capsule Take 40 mg by mouth daily.   Yes Historical Provider, MD    Allergies  Review of patient's allergies indicates no known allergies.  Triage Vitals: BP 101/56 mmHg  Pulse 73  Temp(Src) 98.3 F (36.8 C) (Rectal)  Resp 13  SpO2 95%  Physical Exam General: Well-developed, mildly cachetic male in no acute distress; appearance consistent with age of record HENT: normocephalic; atraumatic Eyes: pupils equal, round and reactive to light; extraocular muscles grossly intact but exam limited due to the level oc consciousness Neck: supple, left anterolateral supraclavicular mass Heart: regular rate and rhythm; no murmurs, rubs or gallops Lungs: Diminished sounds left  base Abdomen: soft; nondistended; nontender; no masses or hepatosplenomegaly; bowel sounds present GU: Dark brown urine Extremities: No deformity; full range of motion; pulses normal, edema and tenderness of RUE, pulses intact Neurologic: Somnolent but localizes to painful stimuli; no facial droop Skin: Warm and dry  ED Course  Procedures   DIAGNOSTIC STUDIES: Oxygen Saturation is 95% on 2 L [place], adequate by my interpretation.    COORDINATION OF CARE: 12:44 AM Discussed treatment plan which includes lab work and XR with family at bedside and they agreed to plan.   MDM  Nursing notes and vitals signs, including pulse oximetry, reviewed.  Summary of this visit's results, reviewed by myself:  EKG Interpretation (done by EMS):  Date & Time: 09/03/2015 11:10 AM  Rate: 79  Rhythm: normal sinus rhythm  QRS Axis: left  Intervals: normal  ST/T Wave abnormalities: nonspecific T wave changes  Conduction Disutrbances:nonspecific intraventricular conduction delay  Narrative Interpretation: Poor R-wave progression  Old EKG Reviewed: No significant changes  Labs:  Results for orders placed or performed during the hospital encounter of 09/03/15 (from the past 24 hour(s))  CBC with Differential/Platelet     Status: Abnormal   Collection Time: 09/04/15  1:02 AM  Result Value Ref Range   WBC 17.5 (H) 4.0 - 10.5 K/uL   RBC 2.47 (L) 4.22 - 5.81 MIL/uL   Hemoglobin 6.5 (LL) 13.0 - 17.0 g/dL   HCT 20.4 (L) 39.0 - 52.0 %   MCV 82.6 78.0 - 100.0 fL   MCH 26.3 26.0 - 34.0 pg   MCHC 31.9 30.0 - 36.0 g/dL   RDW 20.2 (H) 11.5 - 15.5 %   Platelets 195 150 - 400 K/uL   Neutrophils Relative % 79 %   Lymphocytes Relative 11 %   Monocytes Relative 10 %   Eosinophils Relative 0 %   Basophils Relative 0 %   Neutro Abs 13.8 (H) 1.7 - 7.7 K/uL   Lymphs Abs 1.9 0.7 - 4.0 K/uL   Monocytes Absolute 1.8 (H) 0.1 - 1.0 K/uL   Eosinophils Absolute 0.0 0.0 - 0.7 K/uL   Basophils Absolute 0.0 0.0 - 0.1 K/uL    RBC Morphology POLYCHROMASIA PRESENT   Comprehensive metabolic panel     Status: Abnormal   Collection Time: 09/04/15  1:02 AM  Result Value Ref Range   Sodium 139 135 - 145 mmol/L   Potassium 3.8 3.5 - 5.1 mmol/L   Chloride 106 101 - 111 mmol/L   CO2 24 22 - 32 mmol/L   Glucose, Bld 96 65 - 99 mg/dL   BUN 21 (H) 6 - 20 mg/dL   Creatinine, Ser 0.81 0.61 - 1.24 mg/dL   Calcium 8.1 (  L) 8.9 - 10.3 mg/dL   Total Protein 7.4 6.5 - 8.1 g/dL   Albumin 1.4 (L) 3.5 - 5.0 g/dL   AST 22 15 - 41 U/L   ALT 16 (L) 17 - 63 U/L   Alkaline Phosphatase 99 38 - 126 U/L   Total Bilirubin 1.2 0.3 - 1.2 mg/dL   GFR calc non Af Amer >60 >60 mL/min   GFR calc Af Amer >60 >60 mL/min   Anion gap 9 5 - 15  Brain natriuretic peptide     Status: Abnormal   Collection Time: 09/04/15  1:02 AM  Result Value Ref Range   B Natriuretic Peptide 646.1 (H) 0.0 - 100.0 pg/mL  Urinalysis, Routine w reflex microscopic (not at [facility])     Status: Abnormal   Collection Time: 09/04/15  2:27 AM  Result Value Ref Range   Color, Urine AMBER (A) YELLOW   APPearance CLEAR CLEAR   Specific Gravity, Urine 1.023 1.005 - 1.030   pH 5.5 5.0 - 8.0   Glucose, UA NEGATIVE NEGATIVE mg/dL   Hgb urine dipstick NEGATIVE NEGATIVE   Bilirubin Urine SMALL (A) NEGATIVE   Ketones, ur NEGATIVE NEGATIVE mg/dL   Protein, ur NEGATIVE NEGATIVE mg/dL   Urobilinogen, UA 2.0 (H) 0.0 - 1.0 mg/dL   Nitrite NEGATIVE NEGATIVE   Leukocytes, UA NEGATIVE NEGATIVE    Imaging Studies: Dg Chest Port 1 View  09/04/2015   CLINICAL DATA:  In stage metastatic lung cancer and failure to thrive.  EXAM: PORTABLE CHEST - 1 VIEW  COMPARISON:  PET-CT 07/20/2015.  Chest 07/13/2015.  FINDINGS: Cardiac enlargement. No pulmonary vascular congestion. Perihilar and basilar infiltration in both lungs with probable small pleural effusions. Mild interstitial pattern to the right lung base may represent interstitial edema nodular opacity in the right upper lung  corresponding to known pulmonary nodule. Calcified and tortuous aorta.  IMPRESSION: Cardiac enlargement with bilateral perihilar and basilar infiltration and small effusions. This likely indicates congestive changes or fluid overload. Right upper lung nodule corresponding to known lesion.   Electronically Signed   By: Lucienne Capers M.D.   On: 09/04/2015 01:54    I personally performed the services described in this documentation, which was scribed in my presence. The recorded information has been reviewed and is accurate.    Shanon Rosser, MD 09/04/15 [phone]

## 2015-09-04 NOTE — H&P (Signed)
[facility] History and Physical  Osher Oettinger LGX:[id number] DOB: 1941/11/29 DOA: 09/03/2015  Referring physician: Dr. Florina Rogers. PCP: Jared Low, MD  Specialists: Dr. Julien Rogers. Oncologist.  Chief Complaint: Poor oral intake and increasing pain.  History obtained from patient's daughter.  HPI: Jared Rogers is a 74 y.o. male with known history of stage IV non-small cell lung cancer who was recently admitted last month for stroke was brought to the ER from nursing home after patient was found to have poor oral intake and increasing pain and confusion. Chest x-ray was showing congestion versus infiltrate. On exam patient appears confused. Patient also has increasing right upper extremity edema and also has increasing pain in the left lower extremity. At this time after discussing with patient's daughter plan per the family is to keep patient comfortable and no further aggressive measures. Patient's daughter stated that patient is not able to swallow and would like pain medications to be either injection or liquid form. Patient's daughter who lives in [place] would also like to take patient to [place] if arrangements can be made.  Review of Systems: As presented in the history of presenting illness, rest negative.  Past Medical History  Diagnosis Date  . Depression   . Hx of migraines   . Alcohol abuse     In past  . GERD (gastroesophageal reflux disease)   . SOB (shortness of breath)     sometimes at night  . PUD (peptic ulcer disease)     with bleeding ulcers and bowel obstruction in 1994 and 1995  . DDD (degenerative disc disease), cervical   . DDD (degenerative disc disease), lumbosacral   . Hyperlipidemia   . Stroke     cerebellar CVA  . Shingles   . Asthmatic bronchitis   . Hypertension   . Lung cancer   . Anemia    Past Surgical History  Procedure Laterality Date  . Lung biopsy    . Lymph node biopsy     Social History:  reports that he quit smoking  about 7 weeks ago. His smoking use included Cigarettes. He smoked 0.25 packs per day. He has never used smokeless tobacco. He reports that he does not drink alcohol or use illicit drugs. Where does patient live nursing home. Can patient participate in ADLs? No.  No Known Allergies  Family History:  Family History  Problem Relation Age of Onset  . Heart disease Father   . Heart failure Father   . Lung cancer Sister       Prior to Admission medications   Medication Sig Start Date End Date Taking? Authorizing Provider  atorvastatin (LIPITOR) 20 MG tablet Take 1 tablet (20 mg total) by mouth daily at 6 PM. 08/11/15  Yes Jared Girt, DO  carvedilol (COREG) 6.25 MG tablet Take 1 tablet (6.25 mg total) by mouth 2 (two) times daily with a meal. 07/21/15  Yes Jared Cellar, MD  enoxaparin (LOVENOX) 60 MG/0.6ML injection Inject 0.6 mLs (60 mg total) into the skin 2 (two) times daily. 08/11/15  Yes Jared Girt, DO  feeding supplement (BOOST / RESOURCE BREEZE) LIQD Take 1 Container by mouth 2 (two) times daily between meals. 07/21/15  Yes Jared Cellar, MD  HYDROcodone-acetaminophen (NORCO/VICODIN) 5-325 MG per tablet Take 1-2 tablets by mouth every 4 (four) hours as needed for severe pain. 08/25/15  Yes Jared Iles, MD  Multiple Vitamin (MULTIVITAMIN WITH MINERALS) TABS tablet Take 1 tablet by mouth daily.   Yes Historical Provider, MD  omeprazole (PRILOSEC) 20 MG capsule Take 40 mg by mouth daily.   Yes Historical Provider, MD    Physical Exam: Filed Vitals:   09/04/15 0400 09/04/15 0430 09/04/15 0500 09/04/15 0527  BP: 119/58 109/57  122/78  Pulse: 77   81  Temp:    98.1 F (36.7 C)  TempSrc:    Oral  Resp: '19 15  18  '$ Weight:   63 kg (138 lb 14.2 oz)   SpO2: 99%   98%     General:  Moderately built and poorly nourished.  Eyes: Anicteric no pallor.  ENT: No discharge from the ears eyes nose and mouth.  Neck: No mass felt.  Cardiovascular: S1 and S2  heard.  Respiratory: No rhonchi or crepitations.  Abdomen: Soft nontender bowel sounds present.  Skin: Chronic skin changes.  Musculoskeletal: Right upper extremity edema and left lower ex many pain on moving.  Psychiatric: Appears confused.  Neurologic: Appears confused.  Labs on Admission:  Basic Metabolic Panel:  Recent Labs Lab 09/04/15 0102  NA 139  K 3.8  CL 106  CO2 24  GLUCOSE 96  BUN 21*  CREATININE 0.81  CALCIUM 8.1*   Liver Function Tests:  Recent Labs Lab 09/04/15 0102  AST 22  ALT 16*  ALKPHOS 99  BILITOT 1.2  PROT 7.4  ALBUMIN 1.4*   No results for input(s): LIPASE, AMYLASE in the last 168 hours. No results for input(s): AMMONIA in the last 168 hours. CBC:  Recent Labs Lab 09/04/15 0102  WBC 17.5*  NEUTROABS 13.8*  HGB 6.5*  HCT 20.4*  MCV 82.6  PLT 195   Cardiac Enzymes: No results for input(s): CKTOTAL, CKMB, CKMBINDEX, TROPONINI in the last 168 hours.  BNP (last 3 results)  Recent Labs  09/04/15 0102  BNP 646.1*    ProBNP (last 3 results) No results for input(s): PROBNP in the last 8760 hours.  CBG: No results for input(s): GLUCAP in the last 168 hours.  Radiological Exams on Admission: Dg Chest Port 1 View  09/04/2015   CLINICAL DATA:  In stage metastatic lung cancer and failure to thrive.  EXAM: PORTABLE CHEST - 1 VIEW  COMPARISON:  PET-CT 07/20/2015.  Chest 07/13/2015.  FINDINGS: Cardiac enlargement. No pulmonary vascular congestion. Perihilar and basilar infiltration in both lungs with probable small pleural effusions. Mild interstitial pattern to the right lung base may represent interstitial edema nodular opacity in the right upper lung corresponding to known pulmonary nodule. Calcified and tortuous aorta.  IMPRESSION: Cardiac enlargement with bilateral perihilar and basilar infiltration and small effusions. This likely indicates congestive changes or fluid overload. Right upper lung nodule corresponding to known lesion.    Electronically Signed   By: Jared Rogers M.D.   On: 09/04/2015 01:54     Assessment/Plan Principal Problem:   Failure to thrive in adult Active Problems:   PUD (peptic ulcer disease)   Iron deficiency anemia   PE (pulmonary embolism)   Non-small cell carcinoma of lung, stage 4   1. Failure to thrive in a patient with known history of stage IV non-small cell lung cancer. 2. Acute encephalopathy probably secondary to pain and #1. 3. Severe anemia. 4. History of PE on Lovenox. 5. Recent admission for stroke. 6. History of peptic ulcer disease.  Plan - after discussing in patient's daughter was at the bedside plan is to keep patient comfortable and no aggressive measures including no further labs but okay to continue with present medication including Lovenox. I have placed  patient on Roxanol liquid morphine for pain relief. I have consulted palliative team. Patient's daughter would like to take patient to [place] where she lives if it is possible. Social work has been consulted for the same.   DVT Prophylaxis patient is on Lovenox.  Code Status: DO NOT RESUSCITATE.  Family Communication: Discussed with daughter.  Disposition Plan: Admit for observation.   KAKRAKANDY,ARSHAD N. [facility] Pager [phone].  If 7PM-7AM, please contact night-coverage [email or web address] Password [facility] 09/04/2015, 6:14 AM

## 2015-09-04 NOTE — ED Notes (Signed)
Family at bedside. 

## 2015-09-04 NOTE — Progress Notes (Signed)
ANTIBIOTIC CONSULT NOTE - INITIAL  Pharmacy Consult for Vancomycin Indication: HCAP  No Known Allergies  Patient Measurements: Weight: 138 lb 14.2 oz (63 kg) Adjusted Body Weight:   Vital Signs: Temp: 98.1 F (36.7 C) (09/19 0527) Temp Source: Oral (09/19 0527) BP: 122/78 mmHg (09/19 0527) Pulse Rate: 81 (09/19 0527) Intake/Output from previous day: 09/18 0701 - 09/19 0700 In: -  Out: 100 [Urine:100] Intake/Output from this shift: Total I/O In: -  Out: 100 [Urine:100]  Labs:  Recent Labs  09/04/15 0102  WBC 17.5*  HGB 6.5*  PLT 195  CREATININE 0.81   Estimated Creatinine Clearance: 72.4 mL/min (by C-G formula based on Cr of 0.81). No results for input(s): VANCOTROUGH, VANCOPEAK, VANCORANDOM, GENTTROUGH, GENTPEAK, GENTRANDOM, TOBRATROUGH, TOBRAPEAK, TOBRARND, AMIKACINPEAK, AMIKACINTROU, AMIKACIN in the last 72 hours.   Microbiology: No results found for this or any previous visit (from the past 720 hour(s)).  Medical History: Past Medical History  Diagnosis Date  . Depression   . Hx of migraines   . Alcohol abuse     In past  . GERD (gastroesophageal reflux disease)   . SOB (shortness of breath)     sometimes at night  . PUD (peptic ulcer disease)     with bleeding ulcers and bowel obstruction in 1994 and 1995  . DDD (degenerative disc disease), cervical   . DDD (degenerative disc disease), lumbosacral   . Hyperlipidemia   . Stroke     cerebellar CVA  . Shingles   . Asthmatic bronchitis   . Hypertension   . Lung cancer   . Anemia     Medications:  Anti-infectives    Start     Dose/Rate Route Frequency Ordered Stop   09/04/15 1800  vancomycin (VANCOCIN) IVPB 750 mg/150 ml premix     750 mg 150 mL/hr over 60 Minutes Intravenous Every 12 hours 09/04/15 0525     09/04/15 0215  piperacillin-tazobactam (ZOSYN) IVPB 4.5 g  Status:  Discontinued     4.5 g 200 mL/hr over 30 Minutes Intravenous  Once 09/04/15 0206 09/04/15 0212   09/04/15 0215   azithromycin (ZITHROMAX) 500 mg in dextrose 5 % 250 mL IVPB     500 mg 250 mL/hr over 60 Minutes Intravenous  Once 09/04/15 0206 09/04/15 0358   09/04/15 0215  piperacillin-tazobactam (ZOSYN) IVPB 3.375 g     3.375 g 100 mL/hr over 30 Minutes Intravenous  Once 09/04/15 0212 09/04/15 0430   09/04/15 0215  vancomycin (VANCOCIN) IVPB 1000 mg/200 mL premix     1,000 mg 200 mL/hr over 60 Minutes Intravenous  Once 09/04/15 0213       Assessment: Patient with FTT and HCAP.    Goal of Therapy:  Vancomycin trough level 15-20 mcg/ml  Plan:  Measure antibiotic drug levels at steady state Follow up culture results Vancomycin '750mg'$  iv q12hr  Nani Skillern Crowford 09/04/2015,5:29 AM

## 2015-09-04 NOTE — Progress Notes (Signed)
   Follow Up Note  Pt admitted earlier this morning.  Seen after arrived to floor.  74 year old male with stage IV non-small cell lung cancer admitted a month ago for CVA brought back in from nursing facility with poor oral intake and increased pain and confusion. After initial discussion with daughter, she opted for no interventional measures and wanted comfort care. Palliative care consulted who have helped formally make patient full comfort care.  Exam: CV: Regular rate and rhythm, S1-S2 Lungs: Poor inspiratory effort Abd: Soft, hypoactive bowel sounds Ext: Right upper extremity markedly swollen with some tenderness  Present on Admission:  . Failure to thrive in adult . Iron deficiency anemia . Non-small cell carcinoma of lung, stage 4 with failure to thrive: Progression of patient's cancer. At this point full comfort care. Looking at hospice facilities, anticipate discharge tomorrow  . PE (pulmonary embolism) . PUD (peptic ulcer disease)

## 2015-09-04 NOTE — Consult Note (Signed)
Consultation Note Date: 09/04/2015   Patient Name: Jared Rogers  DOB: [birth year]  MRN: [id number]  Age / Sex: 74 y.o., male   PCP: Jared Low, MD Referring Physician: Annita Brod, MD  Reason for Consultation: Establishing goals of care, Non pain symptom management, Pain control and Psychosocial/spiritual support  Palliative Care Assessment and Plan Summary of Established Goals of Care and Medical Treatment Preferences    Palliative Care Discussion Held Today:    This NP Jared Rogers reviewed medical records, received report from team, assessed the patient and then meet at the patient's bedside along with daughter Jared Rogers to discuss diagnosis, prognosis, GOC, EOL wishes, disposition and options.  A detailed discussion was had today regarding advanced directives.  Concepts specific to code status, artifical feeding and hydration, continued IV antibiotics and rehospitalization was had.  The difference between a aggressive medical intervention path  and a palliative comfort care path for this patient at this time was had.  Values and goals of care important to patient and family were attempted to be elicited.  Concept of Hospice and Palliative Care were discussed  Patient's daughter expresses the desire of the family  to focus on comfort, no further life prolongng measures, dignostics.  No artificial feeding or hydration.  Wish is for symptom management to promote comfort.   Natural trajectory and expectations at EOL were discussed.  Questions and concerns addressed.   Family encouraged to call with questions or concerns.  PMT will continue to support holistically.    Primary Decision Maker: daughter, patient is unable to participate due to altered cognition   Goals of Care/Code Status/Advance Care Planning:   Code Status:  DNR/DNI-comfort and dignity are primary focus of care, no further life prolonging measures   Symptom Management:   Daughter verbalizes concern  for visible pain and discomfort intermittently and whenever patient moves or is repositioned.  Pain: Morphine 1 mg IV every 4 hrs schduled  Dyspnea: Roxanol 5 mg po/sl every 1 hr prn  Psycho-social/Spiritual:    Support System: two daughters and other family  Desire for further Chaplaincy support: yes, consult written.  Patient speaks spansih  Prognosis: less than 2 weeks, taking only sips, albumin 1.4, symptom management needs  Discharge Planning:  Pending, will evaluate in am for hospice facility       Chief Complaint: increased pain and weakness  History of Present Illness:   Jared Rogers is a 74 y.o. male with known history of stage IV non-small cell lung cancer who was recently admitted last month for stroke was brought to the ER from nursing home after patient was found to have poor oral intake and increasing pain and confusion.  Patient with increasing right upper extremity edema and also has increasing pain in the left lower extremity.   Patient's daughter expresses the desire of the family  to focus on comfort, no further life prolongng measures, dignostics.  No artificial feeding or hydration.  Wish is for symptom management to promote comfort.   Primary Diagnoses  Present on Admission:  . Failure to thrive in adult . Iron deficiency anemia . Non-small cell carcinoma of lung, stage 4 . PE (pulmonary embolism) . PUD (peptic ulcer disease)  Palliative Review of Systems:    -unable to illict due to altered mental stataus   I have reviewed the medical record, interviewed the patient and family, and examined the patient. The following aspects are pertinent.  Past Medical History  Diagnosis Date  . Depression   .  Hx of migraines   . Alcohol abuse     In past  . GERD (gastroesophageal reflux disease)   . SOB (shortness of breath)     sometimes at night  . PUD (peptic ulcer disease)     with bleeding ulcers and bowel obstruction in 1994 and 1995  . DDD  (degenerative disc disease), cervical   . DDD (degenerative disc disease), lumbosacral   . Hyperlipidemia   . Stroke     cerebellar CVA  . Shingles   . Asthmatic bronchitis   . Hypertension   . Lung cancer   . Anemia    Social History   Social History  . Marital Status: Single    Spouse Name: N/A  . Number of Children: 3  . Years of Education: N/A   Occupational History  . retired    Social History Main Topics  . Smoking status: Former Smoker -- 0.25 packs/day    Types: Cigarettes    Quit date: 07/17/2015  . Smokeless tobacco: Never Used     Comment: Pt smokes 5-6 cigarettes a day  . Alcohol Use: No  . Drug Use: No  . Sexual Activity: Not Currently   Other Topics Concern  . None   Social History Narrative   Family History  Problem Relation Age of Onset  . Heart disease Father   . Heart failure Father   . Lung cancer Sister    Scheduled Meds: . lidocaine       Continuous Infusions:  PRN Meds:.morphine CONCENTRATE, ondansetron **OR** ondansetron (ZOFRAN) IV Medications Prior to Admission:  Prior to Admission medications   Medication Sig Start Date End Date Taking? Authorizing Provider  atorvastatin (LIPITOR) 20 MG tablet Take 1 tablet (20 mg total) by mouth daily at 6 PM. 08/11/15  Yes Jared Girt, DO  carvedilol (COREG) 6.25 MG tablet Take 1 tablet (6.25 mg total) by mouth 2 (two) times daily with a meal. 07/21/15  Yes Jared Cellar, MD  enoxaparin (LOVENOX) 60 MG/0.6ML injection Inject 0.6 mLs (60 mg total) into the skin 2 (two) times daily. 08/11/15  Yes Jared Girt, DO  feeding supplement (BOOST / RESOURCE BREEZE) LIQD Take 1 Container by mouth 2 (two) times daily between meals. 07/21/15  Yes Jared Cellar, MD  HYDROcodone-acetaminophen (NORCO/VICODIN) 5-325 MG per tablet Take 1-2 tablets by mouth every 4 (four) hours as needed for severe pain. 08/25/15  Yes Jared Iles, MD  Multiple Vitamin (MULTIVITAMIN WITH MINERALS) TABS tablet Take 1 tablet by  mouth daily.   Yes Historical Provider, MD  omeprazole (PRILOSEC) 20 MG capsule Take 40 mg by mouth daily.   Yes Historical Provider, MD   No Known Allergies CBC:    Component Value Date/Time   WBC 17.5* 09/04/2015 0102   HGB 6.5* 09/04/2015 0102   HCT 20.4* 09/04/2015 0102   PLT 195 09/04/2015 0102   MCV 82.6 09/04/2015 0102   NEUTROABS 13.8* 09/04/2015 0102   LYMPHSABS 1.9 09/04/2015 0102   MONOABS 1.8* 09/04/2015 0102   EOSABS 0.0 09/04/2015 0102   BASOSABS 0.0 09/04/2015 0102   Comprehensive Metabolic Panel:    Component Value Date/Time   NA 139 09/04/2015 0102   K 3.8 09/04/2015 0102   CL 106 09/04/2015 0102   CO2 24 09/04/2015 0102   BUN 21* 09/04/2015 0102   CREATININE 0.81 09/04/2015 0102   CREATININE 0.73 07/13/2015 1013   GLUCOSE 96 09/04/2015 0102   CALCIUM 8.1* 09/04/2015 0102   AST 22 09/04/2015  0102   ALT 16* 09/04/2015 0102   ALKPHOS 99 09/04/2015 0102   BILITOT 1.2 09/04/2015 0102   PROT 7.4 09/04/2015 0102   ALBUMIN 1.4* 09/04/2015 0102    Physical Exam:  Vital Signs: BP 122/78 mmHg  Pulse 81  Temp(Src) 98.1 F (36.7 C) (Oral)  Resp 18  Wt 63 kg (138 lb 14.2 oz)  SpO2 98% SpO2: SpO2: 98 % O2 Device: O2 Device: Nasal Cannula O2 Flow Rate: O2 Flow Rate (L/min): 2 L/min Intake/output summary:  Intake/Output Summary (Last 24 hours) at 09/04/15 1204 Last data filed at 09/04/15 0230  Gross per 24 hour  Intake      0 ml  Output    100 ml  Net   -100 ml   LBM: Last BM Date:  (daughter states its been almost a week since last BM) Baseline Weight: Weight: 63 kg (138 lb 14.2 oz) Most recent weight: Weight: 63 kg (138 lb 14.2 oz)  Exam Findings:   General: ill appearing, minimally responsive, unable to follow commands HEENT: + temporal muscle wasting CVS: RRR Resp: decreased in bases Extrem: without edema Skin: warm and dry         Palliative Performance Scale: 20 %                Additional Data Reviewed: Recent Labs     09/04/15   0102  WBC  17.5*  HGB  6.5*  PLT  195  NA  139  BUN  21*  CREATININE  0.81     Time In: 1330 Time Out: 1445 Time Total: 75 min  Greater than 50%  of this time was spent counseling and coordinating care related to the above assessment and plan.  Discussed with  Dr Lyman Speller  Signed by: Jared Lessen, NP  Knox Royalty, NP  09/04/2015, 12:04 PM  Please contact Palliative Medicine Team phone at [phone] for questions and concerns.   See AMION for contact information

## 2015-09-05 DIAGNOSIS — R451 Restlessness and agitation: Secondary | ICD-10-CM

## 2015-09-05 DIAGNOSIS — Z515 Encounter for palliative care: Secondary | ICD-10-CM | POA: Diagnosis not present

## 2015-09-05 DIAGNOSIS — R627 Adult failure to thrive: Secondary | ICD-10-CM | POA: Diagnosis not present

## 2015-09-05 DIAGNOSIS — Z66 Do not resuscitate: Secondary | ICD-10-CM | POA: Diagnosis not present

## 2015-09-05 DIAGNOSIS — R52 Pain, unspecified: Secondary | ICD-10-CM | POA: Insufficient documentation

## 2015-09-05 DIAGNOSIS — C3490 Malignant neoplasm of unspecified part of unspecified bronchus or lung: Secondary | ICD-10-CM | POA: Diagnosis not present

## 2015-09-05 MED ORDER — ONDANSETRON HCL 4 MG PO TABS: 4.0000 mg | ORAL_TABLET | Freq: Four times a day (QID) | ORAL | Status: AC | PRN

## 2015-09-05 MED ORDER — BISACODYL 10 MG RE SUPP: 10.0000 mg | Freq: Every day | RECTAL | Status: AC | PRN

## 2015-09-05 MED ORDER — BISACODYL 10 MG RE SUPP: 10.0000 mg | Freq: Every day | RECTAL | Status: DC | PRN

## 2015-09-05 MED ORDER — LORAZEPAM 2 MG/ML IJ SOLN: 1.0000 mg | Freq: Four times a day (QID) | INTRAMUSCULAR | Status: DC | PRN

## 2015-09-05 MED ORDER — LORAZEPAM 2 MG/ML IJ SOLN
1.0000 mg | INTRAMUSCULAR | Status: DC | PRN
  Filled 2015-09-05: qty 1

## 2015-09-05 MED ORDER — SODIUM CHLORIDE 0.45 % IV SOLN
INTRAVENOUS | Status: DC
  Administered 2015-09-05: 09:00:00 via INTRAVENOUS

## 2015-09-05 MED ORDER — MORPHINE SULFATE (CONCENTRATE) 10 MG/0.5ML PO SOLN: 5.0000 mg | ORAL | Status: AC | PRN

## 2015-09-05 MED ORDER — LORAZEPAM 2 MG/ML PO CONC: 1.0000 mg | ORAL | Status: AC | PRN

## 2015-09-05 MED ORDER — LORAZEPAM 2 MG/ML IJ SOLN
1.0000 mg | Freq: Four times a day (QID) | INTRAMUSCULAR | Status: DC
Start: 2015-09-05 — End: 2015-09-06
  Administered 2015-09-05 – 2015-09-06 (×4): 1 mg via INTRAVENOUS
  Filled 2015-09-05 (×3): qty 1

## 2015-09-05 MED ORDER — MORPHINE SULFATE (PF) 2 MG/ML IV SOLN
2.0000 mg | INTRAVENOUS | Status: DC
  Administered 2015-09-05 – 2015-09-06 (×7): 2 mg via INTRAVENOUS
  Filled 2015-09-05 (×7): qty 1

## 2015-09-05 NOTE — Plan of Care (Signed)
Problem: Phase I Progression Outcomes Goal: Voiding-avoid urinary catheter unless indicated Outcome: Completed/Met Date Met:  09/05/15 Condom catheter in place

## 2015-09-05 NOTE — Plan of Care (Signed)
Problem: Phase II Progression Outcomes Goal: Obtain order to discontinue catheter if appropriate Outcome: Completed/Met Date Met:  09/05/15 To be discharged to hospice with condom catheter

## 2015-09-05 NOTE — Care Management Note (Signed)
Case Management Note  Patient Details  Name: Jared Rogers MRN: [id number] Date of Birth: 21-Mar-1941  Subjective/Objective:                   Poor oral intake and increasing pain. Action/Plan:  Discharge planning Expected Discharge Date:  15-Sep-2015               Expected Discharge Plan:  [place]  In-House Referral:  Clinical Social Work  Discharge planning Services  CM Consult  Post Acute Care Choice:    Choice offered to:     DME Arranged:    DME Agency:     HH Arranged:    [place] Agency:     Status of Service:  Completed, signed off  Medicare Important Message Given:    Date Medicare IM Given:    Medicare IM give by:    Date Additional Medicare IM Given:    Additional Medicare Important Message give by:     If discussed at [place] of Stay Meetings, dates discussed:    Additional Comments: [facility]; [place] aware and arranging.  No other CM needs were communicated. Dellie Catholic, RN 09/05/2015, 12:46 PM

## 2015-09-05 NOTE — Discharge Summary (Signed)
Discharge Summary  Jared Rogers BMW:[id number] DOB: [birth year]  PCP: Wenda Low, MD  Admit date: 09/03/2015 Discharge date: 09/05/2015  Time spent: 25 minutes  Recommendations for Outpatient Follow-up:  1. Patient is being made comfort care. All of his previous medications have been discontinued. 2. New medication: Roxanol 5 mg oral solution every 1 hour when necessary for pain 3. New medication: Ativan 1 mg oral solution by mouth every 4 hours for agitation 4. New medication: Zofran 4 mg by mouth every 6 hours when necessary for nausea 5. New medication: Dulcolax suppository as needed daily   Discharge Diagnoses:  Active Hospital Problems   Diagnosis Date Noted  . Failure to thrive in adult 09/04/2015  . Agitation 09/05/2015  . Pain, generalized   . DNR (do not resuscitate) 09/04/2015  . Palliative care encounter 09/04/2015  . Absolute anemia   . Non-small cell carcinoma of lung, stage 4 07/28/2015  . PE (pulmonary embolism)   . Iron deficiency anemia 06/01/2014  . PUD (peptic ulcer disease)     Resolved Hospital Problems   Diagnosis Date Noted Date Resolved  No resolved problems to display.    Discharge Condition: Patient being discharged to hospice facility. He is expected to likely pass in the next 1-2 weeks  Diet recommendation: Comfort feeds only  Filed Weights   09/04/15 0500  Weight: 63 kg (138 lb 14.2 oz)    History of present illness:  74 year old male with stage IV non-small cell lung cancer admitted a month ago for CVA brought back in from nursing facility with poor oral intake and increased pain and confusion.  Hospital Course:  After initial discussion with daughter, she opted for no interventional measures and wanted comfort care. Palliative care consulted who have helped formally make patient full comfort care.  Patient's medications at home were discontinued. He was put on medications only for pain, nausea, agitation and constipation. Seen by  hospice who felt that he would be an appropriate candidate. Patient's prognosis is limited and he is expected to pass within the next few weeks.  Procedures:  None  Consultations:  Palliative medicine  Discharge Exam: BP 121/49 mmHg  Pulse 84  Temp(Src) 98.2 F (36.8 C) (Axillary)  Resp 20  Wt 63 kg (138 lb 14.2 oz)  SpO2 97%  General: Alert and oriented 2, no acute distress Cardiovascular: Regular rate and rhythm, S1 and S2 Respiratory: Decreased breath sounds throughout  Discharge Instructions You were cared for by a hospitalist during your hospital stay. If you have any questions about your discharge medications or the care you received while you were in the hospital after you are discharged, you can call the unit and asked to speak with the hospitalist on call if the hospitalist that took care of you is not available. Once you are discharged, your primary care physician will handle any further medical issues. Please note that NO REFILLS for any discharge medications will be authorized once you are discharged, as it is imperative that you return to your primary care physician (or establish a relationship with a primary care physician if you do not have one) for your aftercare needs so that they can reassess your need for medications and monitor your lab values.  Discharge Instructions    Diet - low sodium heart healthy    Complete by:  As directed             Medication List    STOP taking these medications  atorvastatin 20 MG tablet  Commonly known as:  LIPITOR     carvedilol 6.25 MG tablet  Commonly known as:  COREG     enoxaparin 60 MG/0.6ML injection  Commonly known as:  LOVENOX     feeding supplement Liqd     HYDROcodone-acetaminophen 5-325 MG per tablet  Commonly known as:  NORCO/VICODIN     multivitamin with minerals Tabs tablet     omeprazole 20 MG capsule  Commonly known as:  PRILOSEC      TAKE these medications        bisacodyl 10 MG  suppository  Commonly known as:  DULCOLAX  Place 1 suppository (10 mg total) rectally daily as needed for mild constipation.     LORazepam 2 MG/ML concentrated solution  Commonly known as:  ATIVAN  Take 0.5 mLs (1 mg total) by mouth every 4 (four) hours as needed for anxiety.     morphine CONCENTRATE 10 MG/0.5ML Soln concentrated solution  Take 0.25 mLs (5 mg total) by mouth every hour as needed for moderate pain, severe pain or shortness of breath.     ondansetron 4 MG tablet  Commonly known as:  ZOFRAN  Take 1 tablet (4 mg total) by mouth every 6 (six) hours as needed for nausea.       No Known Allergies    The results of significant diagnostics from this hospitalization (including imaging, microbiology, ancillary and laboratory) are listed below for reference.    Significant Diagnostic Studies: Mr Angiogram Head Wo Contrast  08/10/2015   CLINICAL DATA:  Follow up stroke suggesting embolic disease.  EXAM: MRA HEAD WITHOUT CONTRAST  TECHNIQUE: Angiographic images of the Circle of Willis were obtained using MRA technique without intravenous contrast.  COMPARISON:  Brain MRI from yesterday  FINDINGS: Symmetric vertebral arteries, widely patent into the patent basilar. Left PICA and dominant right AICA. Symmetric superior cerebellar arteries. Mild atherosclerotic narrowing of the upper left PCA branch. No treatable or flow limiting stenosis.  Symmetric carotid arteries. No cavernous sinus stenosis. Irregularity of the mid right cavernous sinus with small medial outpouching is likely atherosclerotic. Small anterior communicating artery is likely present. Symmetric an open M1 and A1 segments. No evidence of branch vessel occlusion. There are 2 outpouchings from the left supraclinoid carotid artery which are directed inferiorly. The more distal outpouching appears to have an emanating vessel on source images, the more proximal without definite vessel.  IMPRESSION: 1. No acute arterial finding or  flow limiting stenosis. 2. Two 2 mm outpouchings from the left supraclinoid carotid artery could reflect infundibula or aneurysms.   Electronically Signed   By: Monte Fantasia M.D.   On: 08/10/2015 11:59   Mr [name] WU Contrast  08/09/2015   CLINICAL DATA:  Non-small-cell lung cancer staging.  Confusion.  EXAM: MRI HEAD WITHOUT AND WITH CONTRAST  TECHNIQUE: Multiplanar, multiecho pulse sequences of the brain and surrounding structures were obtained without and with intravenous contrast.  CONTRAST:  70m MULTIHANCE GADOBENATE DIMEGLUMINE 529 MG/ML IV SOLN  COMPARISON:  07/08/2014  FINDINGS: Calvarium and upper cervical spine: No focal marrow signal abnormality.  Orbits: No significant findings.  Sinuses and Mastoids: Mucosal thickening in the bilateral mastoid air cells.  Brain: There are punctate acute infarcts throughout the bilateral cerebral hemispheric white matter and on the cortex of the bilateral occipital, parietal, and posterior frontal lobes. The largest infarct is in the left occipital white matter measuring 13 mm. No hemorrhagic conversion is noted.  Stippled enhancement on the  surface of the left occipital lobe over a roughly 25 mm linear area. There is also a 5 mm area of cortical enhancement in the left parietal lobe on series 12, image 43.  Relative effacement of left sulci at the vertex which is likely from infarct related swelling.  Generalized brain atrophy with prominent mesial temporal volume loss. Chronic small vessel disease with confluent ischemic gliosis around the lateral ventricles.  Critical Value/emergent results were called by telephone at the time of interpretation on 08/09/2015 at 12:18 pm to Dr. Curt Bears , who verbally acknowledged these results. As requested, the patient is being delivered to the [facility] emergency room.  IMPRESSION: 1. 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 No hemorrhagic conversion.  2. Left occipital and parietal cortex enhancement is likely from subacute infarct. Will need follow-up infused brain MRI in 2 to 3 months to exclude metastatic disease in this patient with history of stage IV lung cancer.   Electronically Signed   By: Monte Fantasia M.D.   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 Morphologic features favor GI or bronchogenic primary.  EXAM: NUCLEAR MEDICINE PET SKULL BASE TO THIGH  TECHNIQUE: 7.04 mCi F-18 FDG was injected intravenously. Full-ring PET imaging was performed from the skull base to thigh after the radiotracer. CT data was obtained and used for attenuation correction and anatomic localization.  FASTING BLOOD GLUCOSE:  Value: 96 mg/dl  COMPARISON:  Chest CT 07/18/2015.  Abdominal pelvic CT 07/19/2015.  FINDINGS: NECK  There are multiple hypermetabolic supraclavicular lymph nodes bilaterally. The largest node on the left measures 2.3 x 3.4 cm on image 38 and has an SUV max of 19.6. On the right, SUV max is 12.1. No hypermetabolic upper cervical lymph nodes demonstrated.There are no lesions of the pharyngeal mucosal space.  CHEST  There are multiple enlarged and hypermetabolic mediastinal and right hilar lymph nodes. The dominant low right paratracheal node measuring 4.4 x 2.8 cm on image 62 has an SUV max of 19.0. The lobulated right upper lobe pulmonary nodule is hypermetabolic with an SUV max of 12.6. A medially superior to the right hilum, there is additional nodular area of hypermetabolic activity, likely nodal. There is probable mild postobstructive pneumonitis posteriorly in the right upper lobe. Biapical scarring, bibasilar atelectasis and small bilateral pleural effusions are present. There is no abnormal metabolic activity within the pleural space. There is a small pericardial  effusion.  ABDOMEN/PELVIS  There is nonspecific low level hypermetabolic activity within the adrenal glands (SUV max 4.2 on the right and 3.5 on the left). There is no focal adrenal nodule, and this is favored to be physiologic. There is no abnormal metabolic activity within liver, spleen or pancreas. There is no hypermetabolic nodal activity. Left renal cysts, diffuse atherosclerosis and ascites again noted.  SKELETON  There is no hypermetabolic activity to suggest osseous metastatic disease. Soft tissue activity posterior to the left femoral greater trochanter, likely inflammatory.  IMPRESSION: 1. Right upper lobe lung cancer with extensive hypermetabolic nodal disease to the right hilum, mediastinum and supraclavicular stations bilaterally. By PET, this is consistent with stage IIIB disease (T1a N3 M0). 2. Low-level adrenal activity bilaterally without focal nodularity, likely physiologic. 3. Similar pleural effusions and ascites. 4. Diffuse atherosclerosis.   Electronically Signed   By: Gwyndolyn Saxon  Lin Landsman M.D.   On: 08/09/2015 11:12   Dg Chest Port 1 View  09/04/2015   CLINICAL DATA:  In stage metastatic lung cancer and failure to thrive.  EXAM: PORTABLE CHEST - 1 VIEW  COMPARISON:  PET-CT 07/20/2015.  Chest 07/13/2015.  FINDINGS: Cardiac enlargement. No pulmonary vascular congestion. Perihilar and basilar infiltration in both lungs with probable small pleural effusions. Mild interstitial pattern to the right lung base may represent interstitial edema nodular opacity in the right upper lung corresponding to known pulmonary nodule. Calcified and tortuous aorta.  IMPRESSION: Cardiac enlargement with bilateral perihilar and basilar infiltration and small effusions. This likely indicates congestive changes or fluid overload. Right upper lung nodule corresponding to known lesion.   Electronically Signed   By: Lucienne Capers M.D.   On: 09/04/2015 01:54    Microbiology: No results found for this or any previous  visit (from the past 240 hour(s)).   Labs: Basic Metabolic Panel:  Recent Labs Lab 09/04/15 0102  NA 139  K 3.8  CL 106  CO2 24  GLUCOSE 96  BUN 21*  CREATININE 0.81  CALCIUM 8.1*   Liver Function Tests:  Recent Labs Lab 09/04/15 0102  AST 22  ALT 16*  ALKPHOS 99  BILITOT 1.2  PROT 7.4  ALBUMIN 1.4*   No results for input(s): LIPASE, AMYLASE in the last 168 hours. No results for input(s): AMMONIA in the last 168 hours. CBC:  Recent Labs Lab 09/04/15 0102  WBC 17.5*  NEUTROABS 13.8*  HGB 6.5*  HCT 20.4*  MCV 82.6  PLT 195   Cardiac Enzymes: No results for input(s): CKTOTAL, CKMB, CKMBINDEX, TROPONINI in the last 168 hours. BNP: BNP (last 3 results)  Recent Labs  09/04/15 0102  BNP 646.1*    ProBNP (last 3 results) No results for input(s): PROBNP in the last 8760 hours.  CBG: No results for input(s): GLUCAP in the last 168 hours.     Signed:  Annita Brod  [facility] 09/05/2015, 11:25 AM

## 2015-09-05 NOTE — Progress Notes (Signed)
Daily Progress Note   Patient Name: Jared Rogers       Date: 09/05/2015 DOB: 1941-01-08  Age: 74 y.o. MRN#: [id number] Attending Physician: Annita Brod, MD Primary Care Physician: Wenda Low, MD Admit Date: 09/03/2015  Reason for Consultation/Follow-up: Establishing goals of care, Non pain symptom management, Pain control, Psychosocial/spiritual support and Terminal care  Subjective:     -patient opens eyes to verbal stimuli, unable to follow commands, furrowed brow and grimace  -daughter remains at bedside, reports although he is much more comfortable today he continues to have generalized "pain when ever he moves, he is very figidity"   Length of Stay: 1 day  Current Medications: Scheduled Meds:  .  morphine injection  2 mg Intravenous Q4H    Continuous Infusions:    PRN Meds: LORazepam, morphine CONCENTRATE, ondansetron **OR** ondansetron (ZOFRAN) IV  Palliative Performance Scale: 20% at best     Vital Signs: BP 121/49 mmHg  Pulse 84  Temp(Src) 98.2 F (36.8 C) (Axillary)  Resp 20  Wt 63 kg (138 lb 14.2 oz)  SpO2 97% SpO2: SpO2: 97 % O2 Device: O2 Device: Nasal Cannula O2 Flow Rate: O2 Flow Rate (L/min): 2 L/min  Intake/output summary:  Intake/Output Summary (Last 24 hours) at 09/05/15 0829 Last data filed at 09/05/15 0745  Gross per 24 hour  Intake      0 ml  Output    550 ml  Net   -550 ml   LBM: Last BM Date:  (daughter states its been almost a week since last BM) Baseline Weight: Weight: 63 kg (138 lb 14.2 oz) Most recent weight: Weight: 63 kg (138 lb 14.2 oz)  Physical Exam:             General: ill appearing, furrowed brow, unable to follow commands HEENT: moist buccal membranes CVS: RRR Resp: irregular breathing pattern Skin: warm and dry Neuro: moves all four, non verbal and agitated   Additional Data Reviewed: Recent Labs     09/04/15  0102  WBC  17.5*  HGB  6.5*  PLT  195  NA  139  BUN  21*  CREATININE  0.81      Problem List:  Patient Active Problem List   Diagnosis Date Noted  . HCAP (healthcare-associated pneumonia) 09/04/2015  . Failure to thrive in adult 09/04/2015  . DNR (do not resuscitate) 09/04/2015  . Palliative care encounter 09/04/2015  . Absolute anemia   . Elevated troponin I level   . CVA (cerebral infarction) 08/09/2015  . Stroke 08/09/2015  . Non-small cell carcinoma of lung, stage 4 07/28/2015  . Protein-calorie malnutrition, severe 07/19/2015  . Lymphadenopathy   . Tobacco abuse 07/18/2015  . PE (pulmonary embolism)   . Dementia with behavioral disturbance 06/13/2014  . Iron deficiency anemia 06/01/2014  . GERD (gastroesophageal reflux disease) 06/01/2014  . Leukocytosis, unspecified 06/01/2014  . LBBB (left bundle branch block) 03/30/2014  . PUD (peptic ulcer disease)   . Hypertension      Palliative Care Assessment & Plan    Code Status:  DNR-comfort and dignity are primary focus of care, no further life prolonging measures    Goals of Care:  Focus of care is comfort, quality and dignity.  No further life prolonging measures   Symptom Management:  Pain/Dyspnea: increase morphine to 2 mg IV every 4 hrs scheduled  Agitation: Ativan 1 mg IV every 6 hrs scheduled   Palliative Prophylaxis:  Stool Softner: Dulcolax supp  5. Prognosis: <  2 weeks, requiring symptom management, decreased hemoglobin 6.5   , increased WBC 17.5,   Albumin 1.4  5. Discharge Planning: Hospice facility, will write for choice   Care plan was discussed with Kirsten RN  Thank you for allowing the Palliative Medicine Team to assist in the care of this patient.   Time In: 0830 Time Out: 0905 Total Time 35 min Prolonged Time Billed  no     Greater than 50%  of this time was spent counseling and coordinating care related to the above assessment and plan.     Knox Royalty, NP  09/05/2015, 8:29 AM  Please contact Palliative Medicine Team phone at [phone] for questions  and concerns.

## 2015-09-06 DIAGNOSIS — Z66 Do not resuscitate: Secondary | ICD-10-CM | POA: Diagnosis not present

## 2015-09-06 DIAGNOSIS — R627 Adult failure to thrive: Secondary | ICD-10-CM | POA: Diagnosis not present

## 2015-09-06 DIAGNOSIS — Z515 Encounter for palliative care: Secondary | ICD-10-CM | POA: Diagnosis not present

## 2015-09-06 DIAGNOSIS — C3490 Malignant neoplasm of unspecified part of unspecified bronchus or lung: Secondary | ICD-10-CM | POA: Diagnosis not present

## 2015-09-16 NOTE — Clinical Social Work Placement (Signed)
   CLINICAL SOCIAL WORK PLACEMENT  NOTE  Date:  09-22-2015  Patient Details  Name: Jared Rogers MRN: [id number] Date of Birth: 1941-05-13  Clinical Social Work is seeking post-discharge placement for this patient at the  (Residential hospice) level of care (*CSW will initial, date and re-position this form in  chart as items are completed):  No   Patient/family provided with [facility] Work Department's list of facilities offering this level of care within the geographic area requested by the patient (or if unable, by the patient's family).  Yes   Patient/family informed of their freedom to choose among providers that offer the needed level of care, that participate in Medicare, Medicaid or managed care program needed by the patient, have an available bed and are willing to accept the patient.  Yes   Patient/family informed of [facility] [place]'s ownership interest in [facility] and [facility], as well as of the fact that they are under no obligation to receive care at these facilities.  PASRR submitted to EDS on       PASRR number received on       Existing PASRR number confirmed on       FL2 transmitted to all facilities in geographic area requested by pt/family on       FL2 transmitted to all facilities within larger geographic area on       Patient informed that his/her managed care company has contracts with or will negotiate with certain facilities, including the following:        Yes   Patient/family informed of bed offers received.  Patient chooses bed at  [facility] )     Physician recommends and patient chooses bed at      Patient to be transferred to  [facility] ) on September 22, 2015.  Patient to be transferred to facility by  [facility])     Patient family notified on Sep 22, 2015 of transfer.  Name of family member notified:  Delfina Redwood     PHYSICIAN       Additional Comment:    _______________________________________________ Raymondo Band, LCSW 09/22/2015, 11:27 AM

## 2015-09-16 NOTE — Clinical Social Work Note (Signed)
Clinical Social Work Assessment  Patient Details  Name: Jared Rogers MRN: [id number] Date of Birth: [birth year]  Date of referral:  09/05/15               Reason for consult:  End of Life/Hospice (Residential Hospice Placement)                Permission sought to share information with:  Case Manager, Facility Sport and exercise psychologist, Family Supports Permission granted to share information::  Yes, Verbal Permission Granted  Name::     [facility]  Agency::  [facility]   Relationship::  Daughter   Contact Information:  [phone]  Housing/Transportation Living arrangements for the past 2 months:  [place] ([place]) Source of Information:  Adult Children (Daughter Jared Rogers) Patient Interpreter Needed:  None Criminal Activity/Legal Involvement Pertinent to Current Situation/Hospitalization:  No - Comment as needed Significant Relationships:  Adult Children Lives with:  Facility Resident Do you feel safe going back to the place where you live?  No Need for family participation in patient care:  Yes (Comment)  Care giving concerns:  Patient's daughter Jared Rogers at bedside. Daughter traveled up from [place] to be with patient. Daughter is understanding of patient's need for residential hospice.   Social Worker assessment / plan: CSW received consult by MD to speak with patient regarding residential hospice. CSW went to speak with patient, but was unable to assess due to medical condition. Daughter Jared Rogers at bedside. Patient is from [facility], but presented to the ED for evaluation for failure to thrive. Per chart, patient has not been eating and has been experiencing a lot of pain. Family is very concerned about his nourishment and level of comfort because he has not been receiving pain medication.   Patient was evaluated by Palliative Care NP Wadie Lessen, and the care plan is for hospice placement.   CSW made contact with CSW Harmon Pier at  [facility] to see if patient could be evaluated. Harmon Pier was able to evaluate, patient meets criteria, and has been offered a bed.   Employment status:  Disabled (Comment on whether or not currently receiving Disability) Insurance information:  Managed Medicare PT Recommendations:   (Residential Hospice) Information / Referral to community resources:  Other (Comment Required) (Residential Hospice)  Patient/Family's Response to care: Daughter appreciated of the services provided by [facility] staff and CSW.   Patient/Family's Understanding of and Emotional Response to Diagnosis, Current Treatment, and Prognosis: Daughter is understanding of patient's conditon and need for residential hospice.   Emotional Assessment Appearance:  Appears stated age Attitude/Demeanor/Rapport:  Unable to Assess (due to declining health) Affect (typically observed):  Unable to Assess Orientation:   (unable to assess due to declining health) Alcohol / Substance use:  Not Applicable Psych involvement (Current and /or in the community):  No (Comment)  Discharge Needs  Concerns to be addressed:  Discharge Planning Concerns Readmission within the last 30 days:  No Current discharge risk:  None Barriers to Discharge:  Barriers Resolved   Raymondo Band, LCSW 09-07-15, 11:01 AM

## 2015-09-16 NOTE — Progress Notes (Signed)
Reported called to [facility].

## 2015-09-16 NOTE — Discharge Summary (Signed)
Discharge Summary  Jared Rogers PNT:[id number] DOB: [birth year]  PCP: Wenda Low, MD  Admit date: 09/03/2015 Discharge date: September 18, 2015  Time spent: 25 minutes  Recommendations for Outpatient Follow-up:  1. Patient is being made comfort care. All of his previous medications have been discontinued. 2. New medication: Roxanol 5 mg oral solution every 1 hour when necessary for pain 3. New medication: Ativan 1 mg oral solution by mouth every 4 hours for agitation 4. New medication: Zofran 4 mg by mouth every 6 hours when necessary for nausea 5. New medication: Dulcolax suppository as needed daily   Discharge Diagnoses:  Active Hospital Problems   Diagnosis Date Noted  . Failure to thrive in adult 09/04/2015  . Agitation 09/05/2015  . Pain, generalized   . DNR (do not resuscitate) 09/04/2015  . Palliative care encounter 09/04/2015  . Absolute anemia   . Non-small cell carcinoma of lung, stage 4 07/28/2015  . PE (pulmonary embolism)   . Iron deficiency anemia 06/01/2014  . PUD (peptic ulcer disease)     Resolved Hospital Problems   Diagnosis Date Noted Date Resolved  No resolved problems to display.    Discharge Condition: Patient being discharged to hospice facility. He is expected to likely pass in the next 1-2 weeks  Diet recommendation: Comfort feeds only  Filed Weights   09/04/15 0500  Weight: 63 kg (138 lb 14.2 oz)    History of present illness:  74 year old male with stage IV non-small cell lung cancer admitted a month ago for CVA brought back in from nursing facility with poor oral intake and increased pain and confusion.  Hospital Course:  After initial discussion with daughter, she opted for no interventional measures and wanted comfort care. Palliative care consulted who have helped formally make patient full comfort care.  Patient's medications at home were discontinued. He was put on medications only for pain, nausea, agitation and constipation. Seen by  hospice who felt that he would be an appropriate candidate. Patient's prognosis is limited and he is expected to pass within the next few weeks.  Procedures:  None  Consultations:  Palliative medicine  Discharge Exam: BP 118/62 mmHg  Pulse 81  Temp(Src) 98.2 F (36.8 C) (Axillary)  Resp 20  Ht '6\' 1"'$  (1.854 m)  Wt 63 kg (138 lb 14.2 oz)  SpO2 96%  General: Alert and oriented 2, no acute distress Cardiovascular: Regular rate and rhythm, S1 and S2 Respiratory: Decreased breath sounds throughout  Discharge Instructions You were cared for by a hospitalist during your hospital stay. If you have any questions about your discharge medications or the care you received while you were in the hospital after you are discharged, you can call the unit and asked to speak with the hospitalist on call if the hospitalist that took care of you is not available. Once you are discharged, your primary care physician will handle any further medical issues. Please note that NO REFILLS for any discharge medications will be authorized once you are discharged, as it is imperative that you return to your primary care physician (or establish a relationship with a primary care physician if you do not have one) for your aftercare needs so that they can reassess your need for medications and monitor your lab values.      Discharge Instructions    Diet - low sodium heart healthy    Complete by:  As directed             Medication List  STOP taking these medications        atorvastatin 20 MG tablet  Commonly known as:  LIPITOR     carvedilol 6.25 MG tablet  Commonly known as:  COREG     enoxaparin 60 MG/0.6ML injection  Commonly known as:  LOVENOX     feeding supplement Liqd     HYDROcodone-acetaminophen 5-325 MG per tablet  Commonly known as:  NORCO/VICODIN     multivitamin with minerals Tabs tablet     omeprazole 20 MG capsule  Commonly known as:  PRILOSEC      TAKE these medications          bisacodyl 10 MG suppository  Commonly known as:  DULCOLAX  Place 1 suppository (10 mg total) rectally daily as needed for mild constipation.     LORazepam 2 MG/ML concentrated solution  Commonly known as:  ATIVAN  Take 0.5 mLs (1 mg total) by mouth every 4 (four) hours as needed for anxiety.     morphine CONCENTRATE 10 MG/0.5ML Soln concentrated solution  Take 0.25 mLs (5 mg total) by mouth every hour as needed for moderate pain, severe pain or shortness of breath.     ondansetron 4 MG tablet  Commonly known as:  ZOFRAN  Take 1 tablet (4 mg total) by mouth every 6 (six) hours as needed for nausea.       No Known Allergies    The results of significant diagnostics from this hospitalization (including imaging, microbiology, ancillary and laboratory) are listed below for reference.    Significant Diagnostic Studies: Mr Angiogram Head Wo Contrast  08/10/2015   CLINICAL DATA:  Follow up stroke suggesting embolic disease.  EXAM: MRA HEAD WITHOUT CONTRAST  TECHNIQUE: Angiographic images of the Circle of Willis were obtained using MRA technique without intravenous contrast.  COMPARISON:  Brain MRI from yesterday  FINDINGS: Symmetric vertebral arteries, widely patent into the patent basilar. Left PICA and dominant right AICA. Symmetric superior cerebellar arteries. Mild atherosclerotic narrowing of the upper left PCA branch. No treatable or flow limiting stenosis.  Symmetric carotid arteries. No cavernous sinus stenosis. Irregularity of the mid right cavernous sinus with small medial outpouching is likely atherosclerotic. Small anterior communicating artery is likely present. Symmetric an open M1 and A1 segments. No evidence of branch vessel occlusion. There are 2 outpouchings from the left supraclinoid carotid artery which are directed inferiorly. The more distal outpouching appears to have an emanating vessel on source images, the more proximal without definite vessel.  IMPRESSION: 1. No  acute arterial finding or flow limiting stenosis. 2. Two 2 mm outpouchings from the left supraclinoid carotid artery could reflect infundibula or aneurysms.   Electronically Signed   By: Monte Fantasia M.D.   On: 08/10/2015 11:59   Mr [name] WJ Contrast  08/09/2015   CLINICAL DATA:  Non-small-cell lung cancer staging.  Confusion.  EXAM: MRI HEAD WITHOUT AND WITH CONTRAST  TECHNIQUE: Multiplanar, multiecho pulse sequences of the brain and surrounding structures were obtained without and with intravenous contrast.  CONTRAST:  55m MULTIHANCE GADOBENATE DIMEGLUMINE 529 MG/ML IV SOLN  COMPARISON:  07/08/2014  FINDINGS: Calvarium and upper cervical spine: No focal marrow signal abnormality.  Orbits: No significant findings.  Sinuses and Mastoids: Mucosal thickening in the bilateral mastoid air cells.  Brain: There are punctate acute infarcts throughout the bilateral cerebral hemispheric white matter and on the cortex of the bilateral occipital, parietal, and posterior frontal lobes. The largest infarct is in the left occipital white matter measuring  13 mm. No hemorrhagic conversion is noted.  Stippled enhancement on the surface of the left occipital lobe over a roughly 25 mm linear area. There is also a 5 mm area of cortical enhancement in the left parietal lobe on series 12, image 43.  Relative effacement of left sulci at the vertex which is likely from infarct related swelling.  Generalized brain atrophy with prominent mesial temporal volume loss. Chronic small vessel disease with confluent ischemic gliosis around the lateral ventricles.  Critical Value/emergent results were called by telephone at the time of interpretation on 08/09/2015 at 12:18 pm to Dr. Curt Bears , who verbally acknowledged these results. As requested, the patient is being delivered to the [facility] emergency room.  IMPRESSION: 1. 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  No hemorrhagic conversion. 2. Left occipital and parietal cortex enhancement is likely from subacute infarct. Will need follow-up infused brain MRI in 2 to 3 months to exclude metastatic disease in this patient with history of stage IV lung cancer.   Electronically Signed   By: Monte Fantasia M.D.   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 Morphologic features favor GI or bronchogenic primary.  EXAM: NUCLEAR MEDICINE PET SKULL BASE TO THIGH  TECHNIQUE: 7.04 mCi F-18 FDG was injected intravenously. Full-ring PET imaging was performed from the skull base to thigh after the radiotracer. CT data was obtained and used for attenuation correction and anatomic localization.  FASTING BLOOD GLUCOSE:  Value: 96 mg/dl  COMPARISON:  Chest CT 07/18/2015.  Abdominal pelvic CT 07/19/2015.  FINDINGS: NECK  There are multiple hypermetabolic supraclavicular lymph nodes bilaterally. The largest node on the left measures 2.3 x 3.4 cm on image 38 and has an SUV max of 19.6. On the right, SUV max is 12.1. No hypermetabolic upper cervical lymph nodes demonstrated.There are no lesions of the pharyngeal mucosal space.  CHEST  There are multiple enlarged and hypermetabolic mediastinal and right hilar lymph nodes. The dominant low right paratracheal node measuring 4.4 x 2.8 cm on image 62 has an SUV max of 19.0. The lobulated right upper lobe pulmonary nodule is hypermetabolic with an SUV max of 12.6. A medially superior to the right hilum, there is additional nodular area of hypermetabolic activity, likely nodal. There is probable mild postobstructive pneumonitis posteriorly in the right upper lobe. Biapical scarring, bibasilar atelectasis and small bilateral pleural effusions are present. There is no abnormal metabolic activity within the pleural space.  There is a small pericardial effusion.  ABDOMEN/PELVIS  There is nonspecific low level hypermetabolic activity within the adrenal glands (SUV max 4.2 on the right and 3.5 on the left). There is no focal adrenal nodule, and this is favored to be physiologic. There is no abnormal metabolic activity within liver, spleen or pancreas. There is no hypermetabolic nodal activity. Left renal cysts, diffuse atherosclerosis and ascites again noted.  SKELETON  There is no hypermetabolic activity to suggest osseous metastatic disease. Soft tissue activity posterior to the left femoral greater trochanter, likely inflammatory.  IMPRESSION: 1. Right upper lobe lung cancer with extensive hypermetabolic nodal disease to the right hilum, mediastinum and supraclavicular stations bilaterally. By PET, this is consistent with stage IIIB disease (T1a N3 M0). 2. Low-level adrenal activity bilaterally without focal nodularity, likely physiologic. 3. Similar pleural effusions and  ascites. 4. Diffuse atherosclerosis.   Electronically Signed   By: Richardean Sale M.D.   On: 08/09/2015 11:12   Dg Chest Port 1 View  09/04/2015   CLINICAL DATA:  In stage metastatic lung cancer and failure to thrive.  EXAM: PORTABLE CHEST - 1 VIEW  COMPARISON:  PET-CT 07/20/2015.  Chest 07/13/2015.  FINDINGS: Cardiac enlargement. No pulmonary vascular congestion. Perihilar and basilar infiltration in both lungs with probable small pleural effusions. Mild interstitial pattern to the right lung base may represent interstitial edema nodular opacity in the right upper lung corresponding to known pulmonary nodule. Calcified and tortuous aorta.  IMPRESSION: Cardiac enlargement with bilateral perihilar and basilar infiltration and small effusions. This likely indicates congestive changes or fluid overload. Right upper lung nodule corresponding to known lesion.   Electronically Signed   By: Lucienne Capers M.D.   On: 09/04/2015 01:54    Microbiology: No results  found for this or any previous visit (from the past 240 hour(s)).   Labs: Basic Metabolic Panel:  Recent Labs Lab 09/04/15 0102  NA 139  K 3.8  CL 106  CO2 24  GLUCOSE 96  BUN 21*  CREATININE 0.81  CALCIUM 8.1*   Liver Function Tests:  Recent Labs Lab 09/04/15 0102  AST 22  ALT 16*  ALKPHOS 99  BILITOT 1.2  PROT 7.4  ALBUMIN 1.4*   No results for input(s): LIPASE, AMYLASE in the last 168 hours. No results for input(s): AMMONIA in the last 168 hours. CBC:  Recent Labs Lab 09/04/15 0102  WBC 17.5*  NEUTROABS 13.8*  HGB 6.5*  HCT 20.4*  MCV 82.6  PLT 195   Cardiac Enzymes: No results for input(s): CKTOTAL, CKMB, CKMBINDEX, TROPONINI in the last 168 hours. BNP: BNP (last 3 results)  Recent Labs  09/04/15 0102  BNP 646.1*    ProBNP (last 3 results) No results for input(s): PROBNP in the last 8760 hours.  CBG: No results for input(s): GLUCAP in the last 168 hours.     Signed:  Annita Brod  [facility] 09/11/2015, 10:11 AM

## 2015-09-16 NOTE — Progress Notes (Signed)
Pt discharged to [facility].  IV d/c with cath intact.  Discharge packet given to transport service.  Pt transported to facility via [facility].  Daughter at bedside and will accompany pt at transport.

## 2015-09-16 NOTE — Consult Note (Signed)
[facility] [employer]   Received request from [employer] for family interest in [facility] 09/05/15. Chart reviewed and spoke with daughter by phone 09/05/15. Plan to meet with daughter this morning between 9 and 10. Will update CSW following meeting.   Thank you. Erling Conte, [place]

## 2015-09-16 NOTE — Consult Note (Signed)
   [facility] CM Inpatient Consult   09-29-2015  Desiderio Dolata 02-22-1941 [id number]   Patient screened for [place] [facility] services on behalf of [facility] Advantage insurance. Chart reviewed. Patient transitioning for comfort care. [facility] Care Management not appropriate at this time.   Marthenia Rolling, MSN-Ed, RN,BSN [facility] Liaison [phone]

## 2015-09-16 NOTE — Progress Notes (Signed)
CSW Harmon Pier from [place] here to complete paperwork with Daughter Debroah Loop. Patient to be transported via [place] to [facility] on today. RN provided number to call report. Patient and daughter informed of patient transport. No further services reported or needed at this time. CSW to sign off. Please consult if further social work services arise.    Lucius Conn, [place] Social Worker [place] [phone]

## 2015-09-16 DEATH — deceased

## 2015-10-30 ENCOUNTER — Encounter: Payer: Self-pay | Admitting: Internal Medicine

## 2015-10-30 NOTE — Progress Notes (Signed)
I placed insurance form for Jared Rogers on desk of nurse for dr. Julien Nordmann.

## 2016-03-18 ENCOUNTER — Encounter: Payer: Self-pay | Admitting: Internal Medicine

## 2016-03-18 NOTE — Progress Notes (Signed)
[employer] form faxed 10/30/15 sent to medical records

## 2017-03-30 IMAGING — CR DG ABDOMEN 2V
3 series · 3 of 3 positions shown · non-contrast
Comparison: None.

CLINICAL DATA: Abdominal pain with unintentional weight loss

EXAM:
ABDOMEN - 2 VIEW

[abdomen erect]
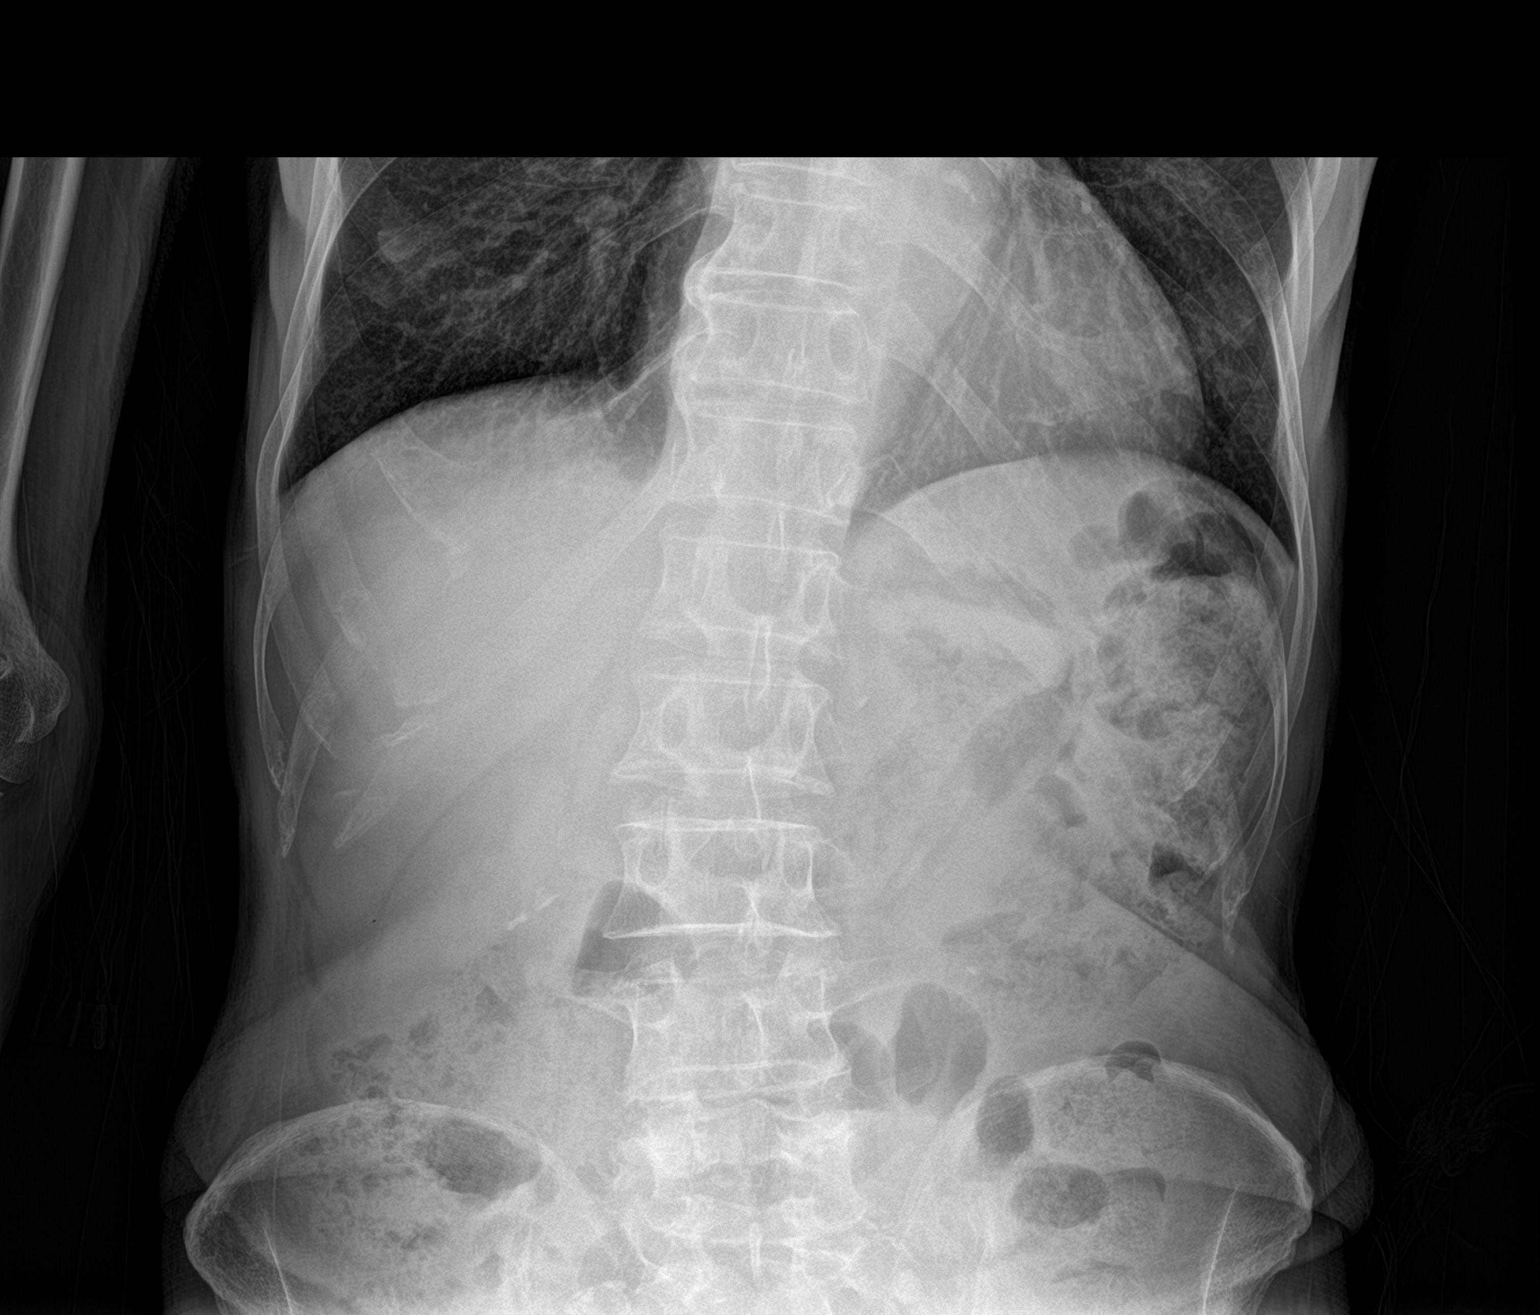

[abdomen supine (1 of 2)]
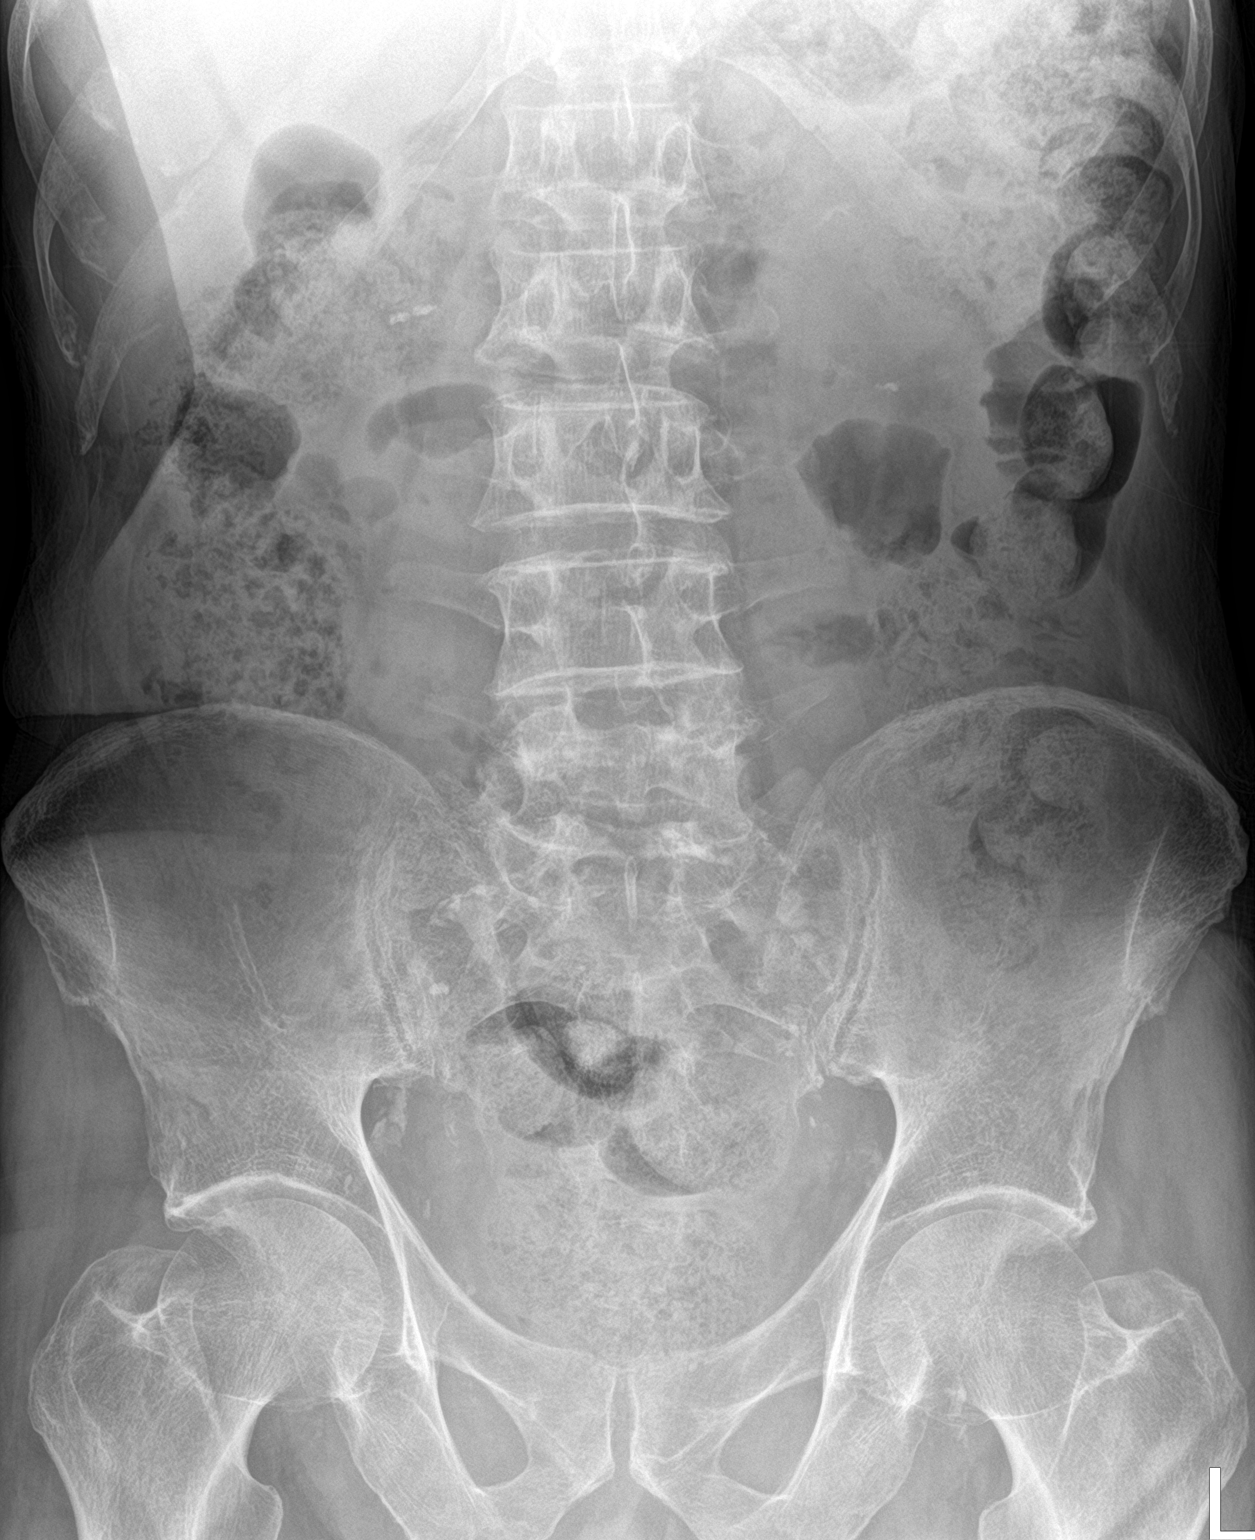

[abdomen supine (2 of 2)]
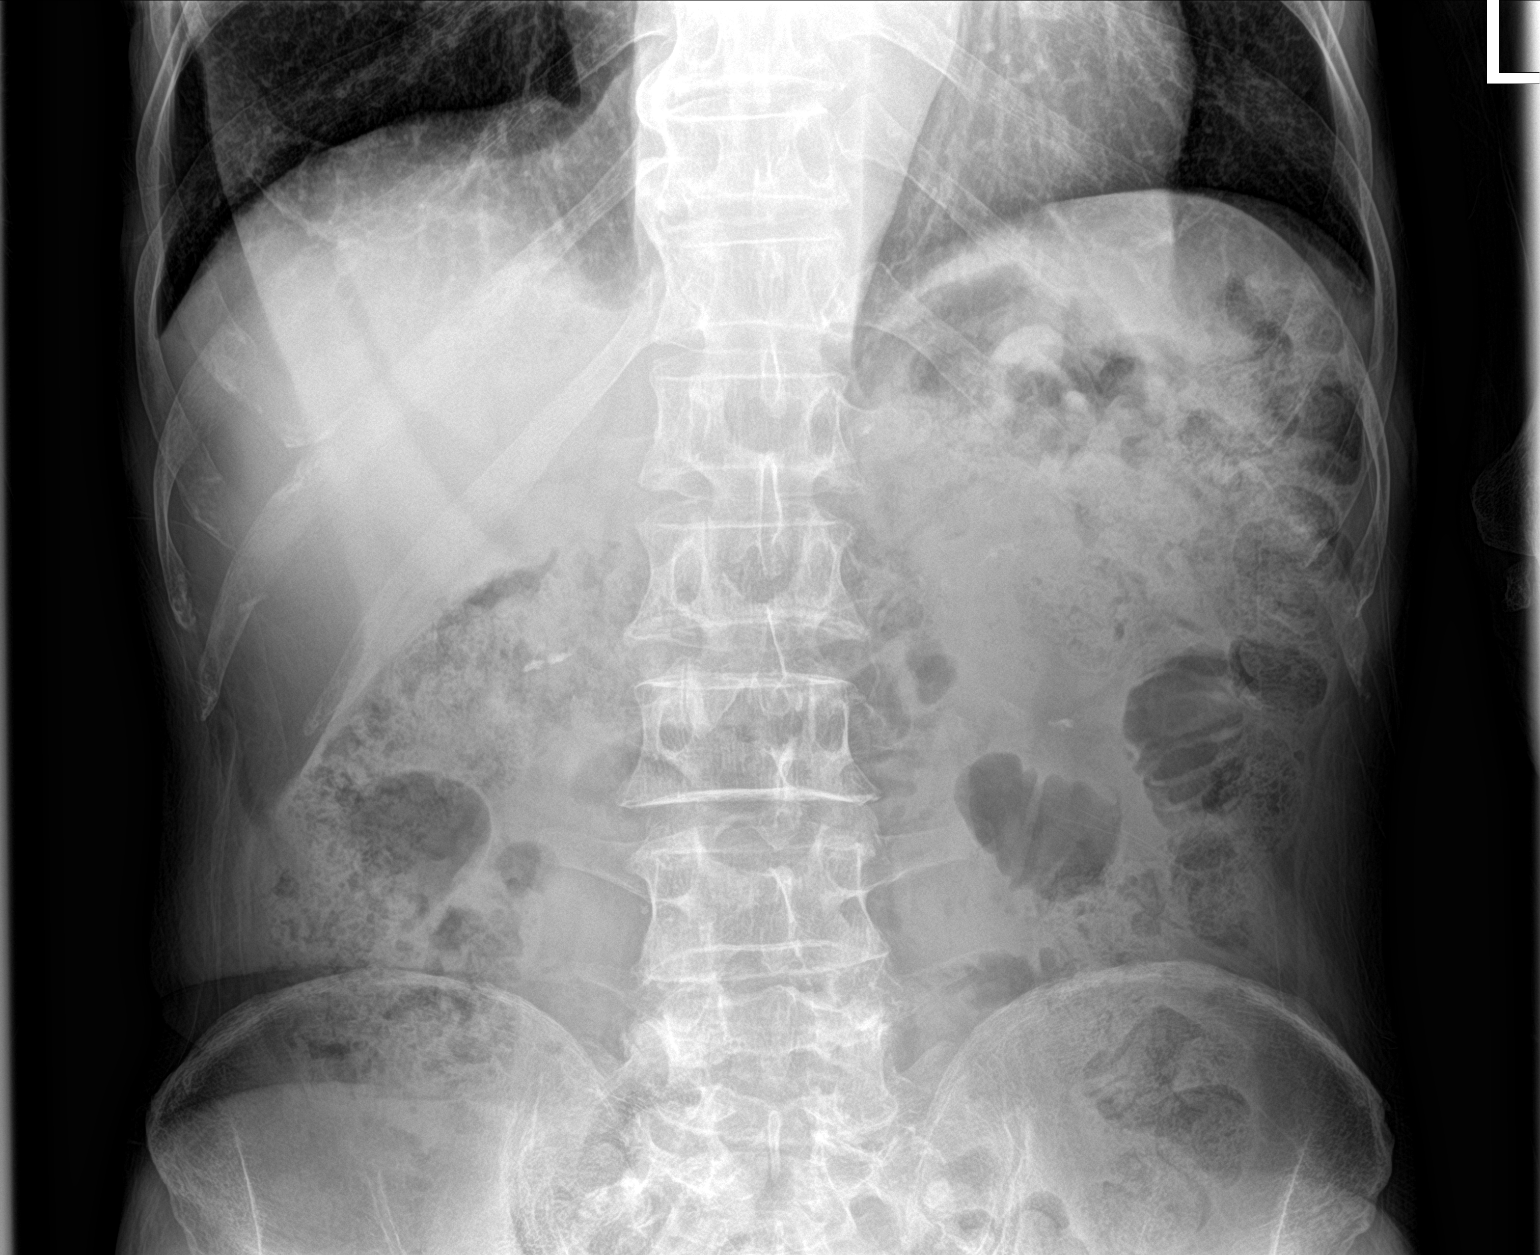

[3 of 3 positions shown; findings below may reference images not displayed]

FINDINGS: Supine and upright images obtained. There is diffuse stool
throughout the colon and rectum. There is no bowel dilatation or
air-fluid level suggesting obstruction. No free air. Lung bases are
clear. There are surgical clips in the right upper quadrant region.
There are vascular calcifications in the pelvis.
IMPRESSION: Diffuse stool throughout colon and rectum. No bowel obstruction. No
free air. Lung bases clear. Nipple shadows noted incidentally
bilaterally.
